# Patient Record
Sex: Male | Born: 1995 | Race: White | Hispanic: No | Marital: Single | Smoking: Never smoker
Health system: Southern US, Community
[De-identification: ages and names within clinical notes are randomized; demographics above are authoritative.]

## PROBLEM LIST (undated history)

## (undated) DIAGNOSIS — L700 Acne vulgaris: Secondary | ICD-10-CM

## (undated) DIAGNOSIS — R030 Elevated blood-pressure reading, without diagnosis of hypertension: Secondary | ICD-10-CM

## (undated) HISTORY — DX: Acne vulgaris: L70.0

## (undated) HISTORY — DX: Elevated blood-pressure reading, without diagnosis of hypertension: R03.0

---
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
## 2016-02-22 ENCOUNTER — Emergency Department (HOSPITAL_COMMUNITY)
Admission: EM | Admit: 2016-02-22 | Discharge: 2016-02-23 | Disposition: A | Discharge status: Home / Self Care | Payer: Managed Care, Other (non HMO) | Attending: Emergency Medicine | Admitting: Emergency Medicine

## 2016-02-22 ENCOUNTER — Encounter (HOSPITAL_COMMUNITY): Payer: Self-pay

## 2016-02-22 ENCOUNTER — Emergency Department (HOSPITAL_COMMUNITY): Payer: Managed Care, Other (non HMO)

## 2016-02-22 DIAGNOSIS — Y929 Unspecified place or not applicable: Secondary | ICD-10-CM | POA: Diagnosis not present

## 2016-02-22 DIAGNOSIS — W230XXA Caught, crushed, jammed, or pinched between moving objects, initial encounter: Secondary | ICD-10-CM | POA: Insufficient documentation

## 2016-02-22 DIAGNOSIS — Y9389 Activity, other specified: Secondary | ICD-10-CM | POA: Insufficient documentation

## 2016-02-22 DIAGNOSIS — S61210A Laceration without foreign body of right index finger without damage to nail, initial encounter: Secondary | ICD-10-CM | POA: Diagnosis present

## 2016-02-22 DIAGNOSIS — Y990 Civilian activity done for income or pay: Secondary | ICD-10-CM | POA: Diagnosis not present

## 2016-02-22 MED ORDER — LIDOCAINE HCL (PF) 1 % IJ SOLN
5.0000 mL | Freq: Once | INTRAMUSCULAR | Status: AC
  Administered 2016-02-22: 5 mL
  Filled 2016-02-22: qty 5

## 2016-02-22 MED ORDER — TETANUS-DIPHTH-ACELL PERTUSSIS 5-2.5-18.5 LF-MCG/0.5 IM SUSP
0.5000 mL | Freq: Once | INTRAMUSCULAR | Status: AC
  Administered 2016-02-22: 0.5 mL via INTRAMUSCULAR
  Filled 2016-02-22: qty 0.5

## 2016-02-22 NOTE — ED Provider Notes (Signed)
[facility]-EMERGENCY DEPT Provider Note   CSN: [id number] Arrival date & time: 02/22/16  2033   By signing my name below, I, Clarisse GougeXavier Herndon, attest that this documentation has been prepared under the direction and in the presence of [facility] M Neese, FNP . Electronically Signed: Clarisse GougeXavier Herndon, Scribe. 02/22/16. 10:41 PM.   History   Chief Complaint Chief Complaint  Patient presents with  . Laceration   The history is provided by the patient and medical records. No language interpreter was used.    HPI Comments: Cody Gill is a 21 y.o. male who presents to the Emergency Department complaining of a right index finger laceration that he sustained ~1830 today. He notes he hit the finger between a hammer and a steel rod. Pt states he has not taken any medications for pain at home. Last tetanus unknown.  Past Medical History:  Diagnosis Date  . Acne vulgaris   . Elevated blood pressure reading     Patient Active Problem List   Diagnosis Date Noted  . Acne vulgaris 11/26/2015  . Elevated blood pressure reading 11/26/2015    History reviewed. No pertinent surgical history.     Home Medications    Prior to Admission medications   Medication Sig Start Date End Date Taking? Authorizing Provider  cetirizine (ZYRTEC) 10 MG tablet Take 10 mg by mouth daily.    Historical Provider, MD  clindamycin-benzoyl peroxide (BENZACLIN) gel Apply 1 application topically 2 (two) times daily. 11/26/15   Doreene NestKatherine K Clark, NP    Family History Family History  Problem Relation Age of Onset  . Hypertension Mother   . Hypertension Father   . Hypertension Maternal Grandmother   . Diabetes Maternal Grandmother     Social History Social History  Substance Use Topics  . Smoking status: Never Smoker  . Smokeless tobacco: Never Used  . Alcohol use No     Allergies   Patient has no known allergies.   Review of Systems Review of Systems  Gastrointestinal: Negative for nausea.    Musculoskeletal: Positive for arthralgias.       Left index finger  Skin: Positive for wound.  Neurological: Negative for syncope.  Psychiatric/Behavioral: The patient is not nervous/anxious.      Physical Exam Updated Vital Signs BP 153/84 (BP Location: Right Arm)   Pulse 89   Temp 98.2 F (36.8 C) (Oral)   Resp 16   Ht 6\' 5"  (1.956 m)   Wt 129.3 kg   SpO2 100%   BMI 33.80 kg/m   Physical Exam  Constitutional: He is oriented to person, place, and time. He appears well-developed and well-nourished. No distress.  HENT:  Head: Normocephalic.  Eyes: EOM are normal.  Neck: Normal range of motion. Neck supple.  Cardiovascular: Normal rate.  Exam reveals no gallop.   Pulmonary/Chest: Effort normal.  Musculoskeletal: Normal range of motion. He exhibits tenderness.       Arms: Neurological: He is alert and oriented to person, place, and time.  Skin:  Wound right index finger  Psychiatric: He has a normal mood and affect. His behavior is normal.  Nursing note and vitals reviewed.    ED Treatments / Results  DIAGNOSTIC STUDIES: Oxygen Saturation is 100% on RA, normal by my interpretation.    COORDINATION OF CARE: 10:41 PM Discussed treatment plan with pt at bedside and pt agreed to plan. Pt prepared for laceration repair. Will order pain medications.  Labs (all labs ordered are listed, but only abnormal results are displayed)  Labs Reviewed - No data to display   Radiology Dg Finger Index Right  Result Date: 02/22/2016 CLINICAL DATA:  Pain after hitting distal right index finger with a hammer. Bleeding near the nail bed. EXAM: RIGHT INDEX FINGER 2+V COMPARISON:  None. FINDINGS: There is no evidence of fracture or dislocation. There appears be a tiny soft tissue laceration along the radial aspect of right index finger distally. No radiopaque foreign body. Joint spaces appear intact. IMPRESSION: No acute osseous abnormality or malalignment of the right index finger. Tiny  soft tissue laceration along the radial aspect of the finger at the level of the distal phalanx. Electronically Signed   By: Tollie Eth M.D.   On: 02/22/2016 22:20    Procedures .Marland KitchenLaceration Repair Date/Time: 02/22/2016 11:27 PM Performed by: Janne Napoleon Authorized by: Janne Napoleon   Consent:    Consent obtained:  Verbal   Consent given by:  Patient   Risks discussed:  Infection   Alternatives discussed:  No treatment Anesthesia (see MAR for exact dosages):    Anesthesia method:  Local infiltration   Local anesthetic:  Lidocaine 1% w/o epi Laceration details:    Location:  Finger   Finger location:  R index finger   Length (cm):  1 Repair type:    Repair type:  Simple Pre-procedure details:    Preparation:  Patient was prepped and draped in usual sterile fashion and imaging obtained to evaluate for foreign bodies Exploration:    Wound extent: no foreign bodies/material noted and no underlying fracture noted     Contaminated: yes   Treatment:    Area cleansed with:  Betadine   Amount of cleaning:  Standard   Irrigation solution:  Sterile water   Irrigation method:  Syringe   Visualized foreign bodies/material removed: no   Skin repair:    Repair method:  Sutures   Suture size:  5-0   Suture material:  Prolene   Suture technique:  Simple interrupted   Number of sutures:  2 Approximation:    Approximation:  Close Post-procedure details:    Dressing:  Sterile dressing   Patient tolerance of procedure:  Tolerated well, no immediate complications     Medications Ordered in ED Medications  lidocaine (PF) (XYLOCAINE) 1 % injection 5 mL (5 mLs Infiltration Given 02/22/16 2257)  Tdap (BOOSTRIX) injection 0.5 mL (0.5 mLs Intramuscular Given 02/22/16 2257)     Initial Impression / Assessment and Plan / ED Course  I have reviewed the triage vital signs and the nursing notes.  Pertinent imaging results that were available during my care of the patient were reviewed by me  and considered in my medical decision making (see chart for details).    I personally performed the services described in this documentation, which was scribed in my presence. The recorded information has been reviewed and is accurate.    Final Clinical Impressions(s) / ED Diagnoses  21 y.o. male with flap laceration of the right index finger stable for d/c without focal neuro deficits. He will f/u in 7 days for suture removal or sooner for problems.  Final diagnoses:  Laceration of right index finger without foreign body without damage to nail, initial encounter    New Prescriptions Discharge Medication List as of 02/22/2016 11:47 PM       Janne Napoleon, NP 02/24/16 1610    Lorre Nick, MD 02/25/16 1445

## 2016-02-22 NOTE — ED Notes (Signed)
Patient's finger re-wrapped.  Cover removed while pt was at xray.

## 2016-02-22 NOTE — ED Triage Notes (Signed)
Pt states that he was working with a steel rod, and hit his finger with a hammer accidentally, laceration to R index finger, last tetanus unknown.

## 2016-11-28 ENCOUNTER — Ambulatory Visit (INDEPENDENT_AMBULATORY_CARE_PROVIDER_SITE_OTHER): Payer: Managed Care, Other (non HMO) | Admitting: Primary Care

## 2016-11-28 ENCOUNTER — Encounter: Payer: Self-pay | Admitting: Primary Care

## 2016-11-28 VITALS — BP 146/82 | HR 92 | Temp 98.5°F | Ht 77.0 in | Wt 312.8 lb

## 2016-11-28 DIAGNOSIS — R030 Elevated blood-pressure reading, without diagnosis of hypertension: Secondary | ICD-10-CM | POA: Diagnosis not present

## 2016-11-28 DIAGNOSIS — Z0000 Encounter for general adult medical examination without abnormal findings: Secondary | ICD-10-CM | POA: Insufficient documentation

## 2016-11-28 DIAGNOSIS — Z0001 Encounter for general adult medical examination with abnormal findings: Secondary | ICD-10-CM | POA: Insufficient documentation

## 2016-11-28 LAB — COMPREHENSIVE METABOLIC PANEL
ALK PHOS: 84 U/L (ref 39–117)
ALT: 21 U/L (ref 0–53)
AST: 17 U/L (ref 0–37)
Albumin: 4.6 g/dL (ref 3.5–5.2)
BILIRUBIN TOTAL: 0.5 mg/dL (ref 0.2–1.2)
BUN: 11 mg/dL (ref 6–23)
CALCIUM: 9.7 mg/dL (ref 8.4–10.5)
CO2: 30 meq/L (ref 19–32)
CREATININE: 0.93 mg/dL (ref 0.40–1.50)
Chloride: 102 mEq/L (ref 96–112)
GFR: 109.21 mL/min (ref >60.00)
Glucose, Bld: 82 mg/dL (ref 70–99)
Potassium: 4.2 mEq/L (ref 3.5–5.1)
Sodium: 139 mEq/L (ref 135–145)
TOTAL PROTEIN: 7.1 g/dL (ref 6.0–8.3)

## 2016-11-28 LAB — LIPID PANEL
CHOL/HDL RATIO: 4
Cholesterol: 145 mg/dL (ref 0–200)
HDL: 34.7 mg/dL — ABNORMAL LOW (ref >39.00)
LDL Cholesterol: 89 mg/dL (ref 0–99)
NonHDL: 110.52
TRIGLYCERIDES: 108 mg/dL (ref 0.0–149.0)
VLDL: 21.6 mg/dL (ref 0.0–40.0)

## 2016-11-28 LAB — HEMOGLOBIN A1C: Hgb A1c MFr Bld: 5.5 % (ref 4.6–6.5)

## 2016-11-28 LAB — TSH: TSH: 1.36 u[IU]/mL (ref 0.35–5.50)

## 2016-11-28 NOTE — Assessment & Plan Note (Signed)
Immunizations UTD. Discussed the importance of a healthy diet and regular exercise in order for weight loss, and to reduce the risk of other medical problems. Exam unremarkable. Labs pending. Follow up in 1 year for annual exam. 

## 2016-11-28 NOTE — Patient Instructions (Signed)
Complete lab work prior to leaving today. I will notify you of your results once received.   You MUST work on weight loss through a healthy diet and regular exercise.   Stop eating fried food, fast food, fatty food. Start looking at the nutrition facts online of your favorite restaurants.   Limit sweet tea as this adds a lot of sugar and calories in your diet.  Increase vegetables, fruit, whole grains, lean protein.  Ensure you are consuming 64 ounces of water daily.  Schedule a follow up visit in 3 months to recheck your blood pressure.   It was a pleasure to see you today!   DASH Eating Plan DASH stands for "Dietary Approaches to Stop Hypertension." The DASH eating plan is a healthy eating plan that has been shown to reduce high blood pressure (hypertension). It may also reduce your risk for type 2 diabetes, heart disease, and stroke. The DASH eating plan may also help with weight loss. What are tips for following this plan? General guidelines  Avoid eating more than 2,300 mg (milligrams) of salt (sodium) a day. If you have hypertension, you may need to reduce your sodium intake to 1,500 mg a day.  Limit alcohol intake to no more than 1 drink a day for nonpregnant women and 2 drinks a day for men. One drink equals 12 oz of beer, 5 oz of wine, or 1 oz of hard liquor.  Work with your health care provider to maintain a healthy body weight or to lose weight. Ask what an ideal weight is for you.  Get at least 30 minutes of exercise that causes your heart to beat faster (aerobic exercise) most days of the week. Activities may include walking, swimming, or biking.  Work with your health care provider or diet and nutrition specialist (dietitian) to adjust your eating plan to your individual calorie needs. Reading food labels  Check food labels for the amount of sodium per serving. Choose foods with less than 5 percent of the Daily Value of sodium. Generally, foods with less than 300 mg of  sodium per serving fit into this eating plan.  To find whole grains, look for the word "whole" as the first word in the ingredient list. Shopping  Buy products labeled as "low-sodium" or "no salt added."  Buy fresh foods. Avoid canned foods and premade or frozen meals. Cooking  Avoid adding salt when cooking. Use salt-free seasonings or herbs instead of table salt or sea salt. Check with your health care provider or pharmacist before using salt substitutes.  Do not fry foods. Cook foods using healthy methods such as baking, boiling, grilling, and broiling instead.  Cook with heart-healthy oils, such as olive, canola, soybean, or sunflower oil. Meal planning   Eat a balanced diet that includes: ? 5 or more servings of fruits and vegetables each day. At each meal, try to fill half of your plate with fruits and vegetables. ? Up to 6-8 servings of whole grains each day. ? Less than 6 oz of lean meat, poultry, or fish each day. A 3-oz serving of meat is about the same size as a deck of cards. One egg equals 1 oz. ? 2 servings of low-fat dairy each day. ? A serving of nuts, seeds, or beans 5 times each week. ? Heart-healthy fats. Healthy fats called Omega-3 fatty acids are found in foods such as flaxseeds and coldwater fish, like sardines, salmon, and mackerel.  Limit how much you eat of the following: ?  Canned or prepackaged foods. ? Food that is high in trans fat, such as fried foods. ? Food that is high in saturated fat, such as fatty meat. ? Sweets, desserts, sugary drinks, and other foods with added sugar. ? Full-fat dairy products.  Do not salt foods before eating.  Try to eat at least 2 vegetarian meals each week.  Eat more home-cooked food and less restaurant, buffet, and fast food.  When eating at a restaurant, ask that your food be prepared with less salt or no salt, if possible. What foods are recommended? The items listed may not be a complete list. Talk with your  dietitian about what dietary choices are best for you. Grains Whole-grain or whole-wheat bread. Whole-grain or whole-wheat pasta. Brown rice. [place]. Bulgur. Whole-grain and low-sodium cereals. Pita bread. Low-fat, low-sodium crackers. Whole-wheat flour tortillas. Vegetables Fresh or frozen vegetables (raw, steamed, roasted, or grilled). Low-sodium or reduced-sodium tomato and vegetable juice. Low-sodium or reduced-sodium tomato sauce and tomato paste. Low-sodium or reduced-sodium canned vegetables. Fruits All fresh, dried, or frozen fruit. Canned fruit in natural juice (without added sugar). Meat and other protein foods Skinless chicken or [place]. Ground chicken or [place]. Pork with fat trimmed off. Fish and seafood. Egg whites. Dried beans, peas, or lentils. Unsalted nuts, nut butters, and seeds. Unsalted canned beans. Lean cuts of beef with fat trimmed off. Low-sodium, lean deli meat. Dairy Low-fat (1%) or fat-free (skim) milk. Fat-free, low-fat, or reduced-fat cheeses. Nonfat, low-sodium ricotta or cottage cheese. Low-fat or nonfat yogurt. Low-fat, low-sodium cheese. Fats and oils Soft margarine without trans fats. Vegetable oil. Low-fat, reduced-fat, or light mayonnaise and salad dressings (reduced-sodium). Canola, safflower, olive, soybean, and sunflower oils. Avocado. Seasoning and other foods Herbs. Spices. Seasoning mixes without salt. Unsalted popcorn and pretzels. Fat-free sweets. What foods are not recommended? The items listed may not be a complete list. Talk with your dietitian about what dietary choices are best for you. Grains Baked goods made with fat, such as croissants, muffins, or some breads. Dry pasta or rice meal packs. Vegetables Creamed or fried vegetables. Vegetables in a cheese sauce. Regular canned vegetables (not low-sodium or reduced-sodium). Regular canned tomato sauce and paste (not low-sodium or reduced-sodium). Regular tomato and vegetable juice (not  low-sodium or reduced-sodium). Angie Fava. Olives. Fruits Canned fruit in a light or heavy syrup. Fried fruit. Fruit in cream or butter sauce. Meat and other protein foods Fatty cuts of meat. Ribs. Fried meat. Berniece Salines. Sausage. Bologna and other processed lunch meats. Salami. Fatback. Hotdogs. Bratwurst. Salted nuts and seeds. Canned beans with added salt. Canned or smoked fish. Whole eggs or egg yolks. Chicken or [place] with skin. Dairy Whole or 2% milk, cream, and half-and-half. Whole or full-fat cream cheese. Whole-fat or sweetened yogurt. Full-fat cheese. Nondairy creamers. Whipped toppings. Processed cheese and cheese spreads. Fats and oils Butter. Stick margarine. Lard. Shortening. Ghee. Bacon fat. Tropical oils, such as coconut, palm kernel, or palm oil. Seasoning and other foods Salted popcorn and pretzels. Onion salt, garlic salt, seasoned salt, table salt, and sea salt. Worcestershire sauce. Tartar sauce. Barbecue sauce. Teriyaki sauce. Soy sauce, including reduced-sodium. Steak sauce. Canned and packaged gravies. Fish sauce. Oyster sauce. Cocktail sauce. Horseradish that you find on the shelf. Ketchup. Mustard. Meat flavorings and tenderizers. Bouillon cubes. Hot sauce and Tabasco sauce. Premade or packaged marinades. Premade or packaged taco seasonings. Relishes. Regular salad dressings. Where to find more information:  National Heart, Lung, and [place]: https://wilson-eaton.com/  American Heart Association: www.heart.org Summary  The DASH eating plan is a healthy eating plan that has been shown to reduce high blood pressure (hypertension). It may also reduce your risk for type 2 diabetes, heart disease, and stroke.  With the DASH eating plan, you should limit salt (sodium) intake to 2,300 mg a day. If you have hypertension, you may need to reduce your sodium intake to 1,500 mg a day.  When on the DASH eating plan, aim to eat more fresh fruits and vegetables, whole grains, lean proteins,  low-fat dairy, and heart-healthy fats.  Work with your health care provider or diet and nutrition specialist (dietitian) to adjust your eating plan to your individual calorie needs. This information is not intended to replace advice given to you by your health care provider. Make sure you discuss any questions you have with your health care provider. Document Released: 12/16/2010 Document Revised: 12/21/2015 Document Reviewed: 12/21/2015 Elsevier Interactive Patient Education  2017 [employer]

## 2016-11-28 NOTE — Progress Notes (Signed)
Subjective:    Patient ID: Cody Gill, male    DOB: 12/23/1995, 21 y.o.   MRN: [id number]  HPI  Cody Gill is a 21 year old male who presents today for complete physical and a chief complaint of elevated blood pressure.  1) Elevated Blood Pressure: History of elevated readings for one year. He's been checking his BP at [facility] and is getting readings of 140's-150's/80's. He denies headaches, dizziness, chest pain. He has a strong family history of hypertension.   BP Readings from Last 3 Encounters:  11/28/16 (!) 146/82  02/23/16 153/84  11/26/15 140/78 (91 %, Z = 1.36 /  69 %, Z = 0.50)*   *BP percentiles are based on the August 2017 AAP Clinical Practice Guideline for boys     Immunizations: -Tetanus: Completed in 2018 -Influenza: Completed this season   Diet: He endorses a fair diet Breakfast: Cereal, fast food Lunch: Arts development officerestaurant food Dinner: Restaurants, home cooked meals (pasta, soup, meat) Snacks: None Desserts: Occasionally  Beverages: Water, sweet tea, soda   Exercise: He walks on the treadmill for 30 minutes at a time 3 days weekly. Eye exam: Completed years ago. Dental exam: Completes semi-annually    Review of Systems  Constitutional: Negative for unexpected weight change.  HENT: Negative for rhinorrhea.   Respiratory: Negative for cough and shortness of breath.   Cardiovascular: Negative for chest pain.  Gastrointestinal: Negative for constipation and diarrhea.  Genitourinary: Negative for difficulty urinating.  Musculoskeletal: Negative for arthralgias and myalgias.  Skin: Negative for rash.  Allergic/Immunologic: Negative for environmental allergies.  Neurological: Negative for dizziness, numbness and headaches.  Psychiatric/Behavioral:       Denies concerns for anxiety and depression       Past Medical History:  Diagnosis Date  . Acne vulgaris   . Elevated blood pressure reading      Social History   Socioeconomic History  . Marital  status: Single    Spouse name: Not on file  . Number of children: Not on file  . Years of education: Not on file  . Highest education level: Not on file  Social Needs  . Financial resource strain: Not on file  . Food insecurity - worry: Not on file  . Food insecurity - inability: Not on file  . Transportation needs - medical: Not on file  . Transportation needs - non-medical: Not on file  Occupational History  . Not on file  Tobacco Use  . Smoking status: Never Smoker  . Smokeless tobacco: Never Used  Substance and Sexual Activity  . Alcohol use: No  . Drug use: No  . Sexual activity: Not on file  Other Topics Concern  . Not on file  Social History Narrative   Single.   Works as a Games developerdiesel mechanic.   Enjoys working on cars.     No past surgical history on file.  Family History  Problem Relation Age of Onset  . Hypertension Mother   . Hypertension Father   . Hypertension Maternal Grandmother   . Diabetes Maternal Grandmother     No Known Allergies  Current Outpatient Medications on File Prior to Visit  Medication Sig Dispense Refill  . cetirizine (ZYRTEC) 10 MG tablet Take 10 mg by mouth daily.    . clindamycin-benzoyl peroxide (BENZACLIN) gel Apply 1 application topically 2 (two) times daily. 50 g 3  . AFLURIA QUADRIVALENT 0.5 ML injection TO BE ADMINISTERED BY PHARMACIST FOR IMMUNIZATION  0   No current facility-administered medications on  file prior to visit.     BP (!) 146/82   Pulse 92   Temp 98.5 F (36.9 C) (Oral)   Ht 6\' 5"  (1.956 m)   Wt (!) 312 lb 12.8 oz (141.9 kg)   SpO2 98%   BMI 37.09 kg/m    Objective:   Physical Exam  Constitutional: He is oriented to person, place, and time. He appears well-nourished.  HENT:  Right Ear: Tympanic membrane and ear canal normal.  Left Ear: Tympanic membrane and ear canal normal.  Nose: Nose normal. Right sinus exhibits no maxillary sinus tenderness and no frontal sinus tenderness. Left sinus exhibits no  maxillary sinus tenderness and no frontal sinus tenderness.  Mouth/Throat: Oropharynx is clear and moist.  Eyes: Conjunctivae and EOM are normal. Pupils are equal, round, and reactive to light.  Neck: Neck supple. Carotid bruit is not present. No thyromegaly present.  Cardiovascular: Normal rate, regular rhythm and normal heart sounds.  Pulmonary/Chest: Effort normal and breath sounds normal. He has no wheezes. He has no rales.  Abdominal: Soft. Bowel sounds are normal. There is no tenderness.  Musculoskeletal: Normal range of motion.  Neurological: He is alert and oriented to person, place, and time. He has normal reflexes. No cranial nerve deficit.  Skin: Skin is warm and dry.  Psychiatric: He has a normal mood and affect.          Assessment & Plan:

## 2016-11-28 NOTE — Assessment & Plan Note (Signed)
Elevated readings for one year, also at drug stores. Strong family history of this. Long discussion today about treatment options, he would like to work on weight loss through diet and exercise. Information provided today. Check for secondary causes of hypertension through labs. Follow up in 3 months for BP check, this will give him time for weight loss.

## 2017-01-19 ENCOUNTER — Other Ambulatory Visit: Payer: Self-pay | Admitting: Primary Care

## 2017-01-19 DIAGNOSIS — L700 Acne vulgaris: Secondary | ICD-10-CM

## 2017-03-02 ENCOUNTER — Ambulatory Visit: Payer: Managed Care, Other (non HMO) | Admitting: Primary Care

## 2017-03-02 DIAGNOSIS — I10 Essential (primary) hypertension: Secondary | ICD-10-CM

## 2017-03-02 MED ORDER — LISINOPRIL 20 MG PO TABS: 20.0000 mg | ORAL_TABLET | Freq: Every day | ORAL | 0 refills | Status: DC

## 2017-03-02 NOTE — Patient Instructions (Signed)
Start lisinopril 20 mg tablets for high blood pressure. Take 1 tablet by mouth once daily.  Start monitoring your blood pressure daily, around the same time of day, for the next 2-3 weeks.  Ensure that you have rested for 30 minutes prior to checking your blood pressure. Record your readings and bring them to your next visit.  Schedule a follow up visit in 2-3 weeks for re-evaluation. Bring your BP numbers.   It was a pleasure to see you today!    DASH Eating Plan DASH stands for "Dietary Approaches to Stop Hypertension." The DASH eating plan is a healthy eating plan that has been shown to reduce high blood pressure (hypertension). It may also reduce your risk for type 2 diabetes, heart disease, and stroke. The DASH eating plan may also help with weight loss. What are tips for following this plan? General guidelines  Avoid eating more than 2,300 mg (milligrams) of salt (sodium) a day. If you have hypertension, you may need to reduce your sodium intake to 1,500 mg a day.  Limit alcohol intake to no more than 1 drink a day for nonpregnant women and 2 drinks a day for men. One drink equals 12 oz of beer, 5 oz of wine, or 1 oz of hard liquor.  Work with your health care provider to maintain a healthy body weight or to lose weight. Ask what an ideal weight is for you.  Get at least 30 minutes of exercise that causes your heart to beat faster (aerobic exercise) most days of the week. Activities may include walking, swimming, or biking.  Work with your health care provider or diet and nutrition specialist (dietitian) to adjust your eating plan to your individual calorie needs. Reading food labels  Check food labels for the amount of sodium per serving. Choose foods with less than 5 percent of the Daily Value of sodium. Generally, foods with less than 300 mg of sodium per serving fit into this eating plan.  To find whole grains, look for the word "whole" as the first word in the ingredient  list. Shopping  Buy products labeled as "low-sodium" or "no salt added."  Buy fresh foods. Avoid canned foods and premade or frozen meals. Cooking  Avoid adding salt when cooking. Use salt-free seasonings or herbs instead of table salt or sea salt. Check with your health care provider or pharmacist before using salt substitutes.  Do not fry foods. Cook foods using healthy methods such as baking, boiling, grilling, and broiling instead.  Cook with heart-healthy oils, such as olive, canola, soybean, or sunflower oil. Meal planning   Eat a balanced diet that includes: ? 5 or more servings of fruits and vegetables each day. At each meal, try to fill half of your plate with fruits and vegetables. ? Up to 6-8 servings of whole grains each day. ? Less than 6 oz of lean meat, poultry, or fish each day. A 3-oz serving of meat is about the same size as a deck of cards. One egg equals 1 oz. ? 2 servings of low-fat dairy each day. ? A serving of nuts, seeds, or beans 5 times each week. ? Heart-healthy fats. Healthy fats called Omega-3 fatty acids are found in foods such as flaxseeds and coldwater fish, like sardines, salmon, and mackerel.  Limit how much you eat of the following: ? Canned or prepackaged foods. ? Food that is high in trans fat, such as fried foods. ? Food that is high in saturated fat, such  as fatty meat. ? Sweets, desserts, sugary drinks, and other foods with added sugar. ? Full-fat dairy products.  Do not salt foods before eating.  Try to eat at least 2 vegetarian meals each week.  Eat more home-cooked food and less restaurant, buffet, and fast food.  When eating at a restaurant, ask that your food be prepared with less salt or no salt, if possible. What foods are recommended? The items listed may not be a complete list. Talk with your dietitian about what dietary choices are best for you. Grains Whole-grain or whole-wheat bread. Whole-grain or whole-wheat pasta. Brown  rice. [place]. Bulgur. Whole-grain and low-sodium cereals. Pita bread. Low-fat, low-sodium crackers. Whole-wheat flour tortillas. Vegetables Fresh or frozen vegetables (raw, steamed, roasted, or grilled). Low-sodium or reduced-sodium tomato and vegetable juice. Low-sodium or reduced-sodium tomato sauce and tomato paste. Low-sodium or reduced-sodium canned vegetables. Fruits All fresh, dried, or frozen fruit. Canned fruit in natural juice (without added sugar). Meat and other protein foods Skinless chicken or [place]. Ground chicken or [place]. Pork with fat trimmed off. Fish and seafood. Egg whites. Dried beans, peas, or lentils. Unsalted nuts, nut butters, and seeds. Unsalted canned beans. Lean cuts of beef with fat trimmed off. Low-sodium, lean deli meat. Dairy Low-fat (1%) or fat-free (skim) milk. Fat-free, low-fat, or reduced-fat cheeses. Nonfat, low-sodium ricotta or cottage cheese. Low-fat or nonfat yogurt. Low-fat, low-sodium cheese. Fats and oils Soft margarine without trans fats. Vegetable oil. Low-fat, reduced-fat, or light mayonnaise and salad dressings (reduced-sodium). Canola, safflower, olive, soybean, and sunflower oils. Avocado. Seasoning and other foods Herbs. Spices. Seasoning mixes without salt. Unsalted popcorn and pretzels. Fat-free sweets. What foods are not recommended? The items listed may not be a complete list. Talk with your dietitian about what dietary choices are best for you. Grains Baked goods made with fat, such as croissants, muffins, or some breads. Dry pasta or rice meal packs. Vegetables Creamed or fried vegetables. Vegetables in a cheese sauce. Regular canned vegetables (not low-sodium or reduced-sodium). Regular canned tomato sauce and paste (not low-sodium or reduced-sodium). Regular tomato and vegetable juice (not low-sodium or reduced-sodium). Angie Fava. Olives. Fruits Canned fruit in a light or heavy syrup. Fried fruit. Fruit in cream or butter  sauce. Meat and other protein foods Fatty cuts of meat. Ribs. Fried meat. Berniece Salines. Sausage. Bologna and other processed lunch meats. Salami. Fatback. Hotdogs. Bratwurst. Salted nuts and seeds. Canned beans with added salt. Canned or smoked fish. Whole eggs or egg yolks. Chicken or [place] with skin. Dairy Whole or 2% milk, cream, and half-and-half. Whole or full-fat cream cheese. Whole-fat or sweetened yogurt. Full-fat cheese. Nondairy creamers. Whipped toppings. Processed cheese and cheese spreads. Fats and oils Butter. Stick margarine. Lard. Shortening. Ghee. Bacon fat. Tropical oils, such as coconut, palm kernel, or palm oil. Seasoning and other foods Salted popcorn and pretzels. Onion salt, garlic salt, seasoned salt, table salt, and sea salt. Worcestershire sauce. Tartar sauce. Barbecue sauce. Teriyaki sauce. Soy sauce, including reduced-sodium. Steak sauce. Canned and packaged gravies. Fish sauce. Oyster sauce. Cocktail sauce. Horseradish that you find on the shelf. Ketchup. Mustard. Meat flavorings and tenderizers. Bouillon cubes. Hot sauce and Tabasco sauce. Premade or packaged marinades. Premade or packaged taco seasonings. Relishes. Regular salad dressings. Where to find more information:  National Heart, Lung, and [place]: https://wilson-eaton.com/  American Heart Association: www.heart.org Summary  The DASH eating plan is a healthy eating plan that has been shown to reduce high blood pressure (hypertension). It may also reduce your risk  for type 2 diabetes, heart disease, and stroke.  With the DASH eating plan, you should limit salt (sodium) intake to 2,300 mg a day. If you have hypertension, you may need to reduce your sodium intake to 1,500 mg a day.  When on the DASH eating plan, aim to eat more fresh fruits and vegetables, whole grains, lean proteins, low-fat dairy, and heart-healthy fats.  Work with your health care provider or diet and nutrition specialist (dietitian) to adjust  your eating plan to your individual calorie needs. This information is not intended to replace advice given to you by your health care provider. Make sure you discuss any questions you have with your health care provider. Document Released: 12/16/2010 Document Revised: 12/21/2015 Document Reviewed: 12/21/2015 Elsevier Interactive Patient Education  [employer].

## 2017-03-02 NOTE — Assessment & Plan Note (Signed)
Mild improvement in BP since last visit, has been working on lifestyle changes. Long discussion today regarding continued improvement in diet and exercise, he's not sure if he can do this. He's continued elevated readings at home. Given strong family history with little BP improvement and lack of serious effort for weight loss moving forward, will treat.  Rx for lisinopril 20 mg sent to pharmacy. He will continue to monitor BP at home. Return in 2-3 weeks for follow up and BMP.

## 2017-03-02 NOTE — Progress Notes (Signed)
Subjective:    Patient ID: Cody Gill, male    DOB: 03-21-1995, 22 y.o.   MRN: [id number]  HPI  Mr. Bollier is a 22 year old male who presents today for follow up of elevated blood pressure readings. He's noted elevated blood pressure readings over the last several years which run 140150's/80's. During his last visit his labs were grossly unremarkable. He has a strong family history of hypertension in both parents.  He was encouraged to work on weight loss and return in three months for evaluation.   Since his last visit he's been trying to work on weight loss through diet and exercise. He's not eating out much at restaurants, or will pick healthier options. He is now walking on the treadmill twice weekly, sometimes more. He's been checking his BP at home starting 5 days ago which is running 135's-140's/70's.  He denies headaches, dizziness, chest pain.   Wt Readings from Last 3 Encounters:  03/02/17 (!) 305 lb (138.3 kg)  11/28/16 (!) 312 lb 12.8 oz (141.9 kg)  02/22/16 285 lb (129.3 kg)     BP Readings from Last 3 Encounters:  03/02/17 140/78  11/28/16 (!) 146/82  02/23/16 153/84     Review of Systems  Eyes: Negative for visual disturbance.  Respiratory: Negative for shortness of breath.   Cardiovascular: Negative for chest pain.  Neurological: Negative for dizziness and headaches.       Past Medical History:  Diagnosis Date  . Acne vulgaris   . Elevated blood pressure reading      Social History   Socioeconomic History  . Marital status: Single    Spouse name: Not on file  . Number of children: Not on file  . Years of education: Not on file  . Highest education level: Not on file  Social Needs  . Financial resource strain: Not on file  . Food insecurity - worry: Not on file  . Food insecurity - inability: Not on file  . Transportation needs - medical: Not on file  . Transportation needs - non-medical: Not on file  Occupational History  . Not on file    Tobacco Use  . Smoking status: Never Smoker  . Smokeless tobacco: Never Used  Substance and Sexual Activity  . Alcohol use: No  . Drug use: No  . Sexual activity: Not on file  Other Topics Concern  . Not on file  Social History Narrative   Single.   Works as a Games developer.   Enjoys working on cars.     No past surgical history on file.  Family History  Problem Relation Age of Onset  . Hypertension Mother   . Hypertension Father   . Hypertension Maternal Grandmother   . Diabetes Maternal Grandmother     No Known Allergies  Current Outpatient Medications on File Prior to Visit  Medication Sig Dispense Refill  . cetirizine (ZYRTEC) 10 MG tablet Take 10 mg by mouth daily.    . clindamycin-benzoyl peroxide (BENZACLIN) gel APPLY 1 APPLICATION TOPICALLY 2 (TWO) TIMES DAILY 50 g 1   No current facility-administered medications on file prior to visit.     BP 140/78   Pulse 79   Temp 98.3 F (36.8 C) (Oral)   Ht 6\' 5"  (1.956 m)   Wt (!) 305 lb (138.3 kg)   SpO2 98%   BMI 36.17 kg/m    Objective:   Physical Exam  Constitutional: He is oriented to person, place, and time. He appears  well-nourished.  Neck: Neck supple.  Cardiovascular: Normal rate and regular rhythm.  Pulmonary/Chest: Effort normal and breath sounds normal. He has no wheezes. He has no rales.  Neurological: He is alert and oriented to person, place, and time.  Skin: Skin is warm and dry.          Assessment & Plan:

## 2017-03-23 ENCOUNTER — Encounter: Payer: Self-pay | Admitting: Primary Care

## 2017-03-23 ENCOUNTER — Ambulatory Visit: Payer: Managed Care, Other (non HMO) | Admitting: Primary Care

## 2017-03-23 DIAGNOSIS — I10 Essential (primary) hypertension: Secondary | ICD-10-CM

## 2017-03-23 LAB — BASIC METABOLIC PANEL
BUN: 17 mg/dL (ref 6–23)
CALCIUM: 9.4 mg/dL (ref 8.4–10.5)
CO2: 30 mEq/L (ref 19–32)
CREATININE: 1.04 mg/dL (ref 0.40–1.50)
Chloride: 104 mEq/L (ref 96–112)
GFR: 95.7 mL/min (ref >60.00)
Glucose, Bld: 97 mg/dL (ref 70–99)
Potassium: 4 mEq/L (ref 3.5–5.1)
Sodium: 140 mEq/L (ref 135–145)

## 2017-03-23 MED ORDER — LISINOPRIL 20 MG PO TABS: 20.0000 mg | ORAL_TABLET | Freq: Every day | ORAL | 1 refills | Status: DC

## 2017-03-23 NOTE — Progress Notes (Signed)
Subjective:    Patient ID: Cody Gill, male    DOB: 1995-06-01, 22 y.o.   MRN: [id number]  HPI  Cody Gill is a 22 year old me with a history of obesity and newly diagnosed hypertension who presents today for follow up.  He was last evaluated in late February 2019 with continued elevated blood pressure readings despite lifestyle improvements. There was slight decrease in readings, but he reported that he didn't think he could continue to work on lifestyle improvements enough in order to get BP down to goal, he also has a strong family history of hypertension. He was prescribed lisinopril 20 mg and encouraged to continue with lifestyle improvements, and follow up in 2-3 weeks.  Since his last visit he's checking his BP at home and is getting readings 130's/80's mostly, some 140's/90's soon after he started the medication. He's also been under stress and isn't sleeping much as he's working during the day, then working on his kitchen floors at night. He denies chest pain, cough. He has noticed some dizziness with abrupt position changes.   BP Readings from Last 3 Encounters:  03/23/17 138/74  03/02/17 140/78  11/28/16 (!) 146/82     Review of Systems  Constitutional: Negative for fatigue.  Eyes: Negative for visual disturbance.  Respiratory: Negative for cough and shortness of breath.   Cardiovascular: Negative for chest pain.  Neurological: Negative for headaches.       Past Medical History:  Diagnosis Date  . Acne vulgaris   . Elevated blood pressure reading      Social History   Socioeconomic History  . Marital status: Single    Spouse name: Not on file  . Number of children: Not on file  . Years of education: Not on file  . Highest education level: Not on file  Social Needs  . Financial resource strain: Not on file  . Food insecurity - worry: Not on file  . Food insecurity - inability: Not on file  . Transportation needs - medical: Not on file  .  Transportation needs - non-medical: Not on file  Occupational History  . Not on file  Tobacco Use  . Smoking status: Never Smoker  . Smokeless tobacco: Never Used  Substance and Sexual Activity  . Alcohol use: No  . Drug use: No  . Sexual activity: Not on file  Other Topics Concern  . Not on file  Social History Narrative   Single.   Works as a Games developer.   Enjoys working on cars.     No past surgical history on file.  Family History  Problem Relation Age of Onset  . Hypertension Mother   . Hypertension Father   . Hypertension Maternal Grandmother   . Diabetes Maternal Grandmother     No Known Allergies  Current Outpatient Medications on File Prior to Visit  Medication Sig Dispense Refill  . cetirizine (ZYRTEC) 10 MG tablet Take 10 mg by mouth daily.    Marland Kitchen lisinopril (PRINIVIL,ZESTRIL) 20 MG tablet Take 1 tablet (20 mg total) by mouth daily. 30 tablet 0  . clindamycin-benzoyl peroxide (BENZACLIN) gel APPLY 1 APPLICATION TOPICALLY 2 (TWO) TIMES DAILY (Patient not taking: Reported on 03/23/2017) 50 g 1   No current facility-administered medications on file prior to visit.     BP 138/74   Pulse 74   Temp 98.3 F (36.8 C) (Oral)   Ht 6\' 5"  (1.956 m)   Wt (!) 305 lb 8 oz (138.6 kg)  SpO2 98%   BMI 36.23 kg/m    Objective:   Physical Exam  Constitutional: He is oriented to person, place, and time. He appears well-nourished.  Neck: Neck supple.  Cardiovascular: Normal rate and regular rhythm.  Pulmonary/Chest: Effort normal and breath sounds normal. He has no wheezes. He has no rales.  Neurological: He is alert and oriented to person, place, and time.  Skin: Skin is warm and dry.          Assessment & Plan:

## 2017-03-23 NOTE — Assessment & Plan Note (Signed)
Improved on lisinopril 20 mg, home readings lower. Will have him continue on this regimen for now, continue to monitor home readings and report readings at or above 135/90 on a consistent basis.   Follow up in 3 months for BP check. BMP pending today.

## 2017-03-23 NOTE — Patient Instructions (Signed)
Stop by the lab prior to leaving today. I will notify you of your results once received.   Continue lisinopril 20 mg tablets for high blood pressure.  Monitor your blood pressure 1-2 times weekly, report readings at or above 135/90 on a consistent basis.   Schedule a follow up visit for 3 months.  It was a pleasure to see you today!   DASH Eating Plan DASH stands for "Dietary Approaches to Stop Hypertension." The DASH eating plan is a healthy eating plan that has been shown to reduce high blood pressure (hypertension). It may also reduce your risk for type 2 diabetes, heart disease, and stroke. The DASH eating plan may also help with weight loss. What are tips for following this plan? General guidelines  Avoid eating more than 2,300 mg (milligrams) of salt (sodium) a day. If you have hypertension, you may need to reduce your sodium intake to 1,500 mg a day.  Limit alcohol intake to no more than 1 drink a day for nonpregnant women and 2 drinks a day for men. One drink equals 12 oz of beer, 5 oz of wine, or 1 oz of hard liquor.  Work with your health care provider to maintain a healthy body weight or to lose weight. Ask what an ideal weight is for you.  Get at least 30 minutes of exercise that causes your heart to beat faster (aerobic exercise) most days of the week. Activities may include walking, swimming, or biking.  Work with your health care provider or diet and nutrition specialist (dietitian) to adjust your eating plan to your individual calorie needs. Reading food labels  Check food labels for the amount of sodium per serving. Choose foods with less than 5 percent of the Daily Value of sodium. Generally, foods with less than 300 mg of sodium per serving fit into this eating plan.  To find whole grains, look for the word "whole" as the first word in the ingredient list. Shopping  Buy products labeled as "low-sodium" or "no salt added."  Buy fresh foods. Avoid canned foods and  premade or frozen meals. Cooking  Avoid adding salt when cooking. Use salt-free seasonings or herbs instead of table salt or sea salt. Check with your health care provider or pharmacist before using salt substitutes.  Do not fry foods. Cook foods using healthy methods such as baking, boiling, grilling, and broiling instead.  Cook with heart-healthy oils, such as olive, canola, soybean, or sunflower oil. Meal planning   Eat a balanced diet that includes: ? 5 or more servings of fruits and vegetables each day. At each meal, try to fill half of your plate with fruits and vegetables. ? Up to 6-8 servings of whole grains each day. ? Less than 6 oz of lean meat, poultry, or fish each day. A 3-oz serving of meat is about the same size as a deck of cards. One egg equals 1 oz. ? 2 servings of low-fat dairy each day. ? A serving of nuts, seeds, or beans 5 times each week. ? Heart-healthy fats. Healthy fats called Omega-3 fatty acids are found in foods such as flaxseeds and coldwater fish, like sardines, salmon, and mackerel.  Limit how much you eat of the following: ? Canned or prepackaged foods. ? Food that is high in trans fat, such as fried foods. ? Food that is high in saturated fat, such as fatty meat. ? Sweets, desserts, sugary drinks, and other foods with added sugar. ? Full-fat dairy products.  Do  not salt foods before eating.  Try to eat at least 2 vegetarian meals each week.  Eat more home-cooked food and less restaurant, buffet, and fast food.  When eating at a restaurant, ask that your food be prepared with less salt or no salt, if possible. What foods are recommended? The items listed may not be a complete list. Talk with your dietitian about what dietary choices are best for you. Grains Whole-grain or whole-wheat bread. Whole-grain or whole-wheat pasta. Brown rice. [place]. Bulgur. Whole-grain and low-sodium cereals. Pita bread. Low-fat, low-sodium crackers.  Whole-wheat flour tortillas. Vegetables Fresh or frozen vegetables (raw, steamed, roasted, or grilled). Low-sodium or reduced-sodium tomato and vegetable juice. Low-sodium or reduced-sodium tomato sauce and tomato paste. Low-sodium or reduced-sodium canned vegetables. Fruits All fresh, dried, or frozen fruit. Canned fruit in natural juice (without added sugar). Meat and other protein foods Skinless chicken or [place]. Ground chicken or [place]. Pork with fat trimmed off. Fish and seafood. Egg whites. Dried beans, peas, or lentils. Unsalted nuts, nut butters, and seeds. Unsalted canned beans. Lean cuts of beef with fat trimmed off. Low-sodium, lean deli meat. Dairy Low-fat (1%) or fat-free (skim) milk. Fat-free, low-fat, or reduced-fat cheeses. Nonfat, low-sodium ricotta or cottage cheese. Low-fat or nonfat yogurt. Low-fat, low-sodium cheese. Fats and oils Soft margarine without trans fats. Vegetable oil. Low-fat, reduced-fat, or light mayonnaise and salad dressings (reduced-sodium). Canola, safflower, olive, soybean, and sunflower oils. Avocado. Seasoning and other foods Herbs. Spices. Seasoning mixes without salt. Unsalted popcorn and pretzels. Fat-free sweets. What foods are not recommended? The items listed may not be a complete list. Talk with your dietitian about what dietary choices are best for you. Grains Baked goods made with fat, such as croissants, muffins, or some breads. Dry pasta or rice meal packs. Vegetables Creamed or fried vegetables. Vegetables in a cheese sauce. Regular canned vegetables (not low-sodium or reduced-sodium). Regular canned tomato sauce and paste (not low-sodium or reduced-sodium). Regular tomato and vegetable juice (not low-sodium or reduced-sodium). Angie Fava. Olives. Fruits Canned fruit in a light or heavy syrup. Fried fruit. Fruit in cream or butter sauce. Meat and other protein foods Fatty cuts of meat. Ribs. Fried meat. Berniece Salines. Sausage. Bologna and other  processed lunch meats. Salami. Fatback. Hotdogs. Bratwurst. Salted nuts and seeds. Canned beans with added salt. Canned or smoked fish. Whole eggs or egg yolks. Chicken or [place] with skin. Dairy Whole or 2% milk, cream, and half-and-half. Whole or full-fat cream cheese. Whole-fat or sweetened yogurt. Full-fat cheese. Nondairy creamers. Whipped toppings. Processed cheese and cheese spreads. Fats and oils Butter. Stick margarine. Lard. Shortening. Ghee. Bacon fat. Tropical oils, such as coconut, palm kernel, or palm oil. Seasoning and other foods Salted popcorn and pretzels. Onion salt, garlic salt, seasoned salt, table salt, and sea salt. Worcestershire sauce. Tartar sauce. Barbecue sauce. Teriyaki sauce. Soy sauce, including reduced-sodium. Steak sauce. Canned and packaged gravies. Fish sauce. Oyster sauce. Cocktail sauce. Horseradish that you find on the shelf. Ketchup. Mustard. Meat flavorings and tenderizers. Bouillon cubes. Hot sauce and Tabasco sauce. Premade or packaged marinades. Premade or packaged taco seasonings. Relishes. Regular salad dressings. Where to find more information:  National Heart, Lung, and [place]: https://wilson-eaton.com/  American Heart Association: www.heart.org Summary  The DASH eating plan is a healthy eating plan that has been shown to reduce high blood pressure (hypertension). It may also reduce your risk for type 2 diabetes, heart disease, and stroke.  With the DASH eating plan, you should limit salt (sodium) intake  to 2,300 mg a day. If you have hypertension, you may need to reduce your sodium intake to 1,500 mg a day.  When on the DASH eating plan, aim to eat more fresh fruits and vegetables, whole grains, lean proteins, low-fat dairy, and heart-healthy fats.  Work with your health care provider or diet and nutrition specialist (dietitian) to adjust your eating plan to your individual calorie needs. This information is not intended to replace advice given to  you by your health care provider. Make sure you discuss any questions you have with your health care provider. Document Released: 12/16/2010 Document Revised: 12/21/2015 Document Reviewed: 12/21/2015 Elsevier Interactive Patient Education  [employer]2018 Elsevier Inc.

## 2017-03-24 ENCOUNTER — Encounter: Payer: Self-pay | Admitting: *Deleted

## 2017-03-27 ENCOUNTER — Encounter: Payer: Self-pay | Admitting: *Deleted

## 2017-06-23 ENCOUNTER — Ambulatory Visit: Payer: Managed Care, Other (non HMO) | Admitting: Primary Care

## 2017-06-23 ENCOUNTER — Encounter: Payer: Self-pay | Admitting: Primary Care

## 2017-06-23 DIAGNOSIS — I10 Essential (primary) hypertension: Secondary | ICD-10-CM | POA: Diagnosis not present

## 2017-06-23 NOTE — Patient Instructions (Signed)
Continue lisinopril 20 mg tablets for high blood pressure.  Start exercising. You should be getting 150 minutes of moderate intensity exercise weekly.  It's important to improve your diet by reducing consumption of fast food, fried food, processed snack foods, sugary drinks. Increase consumption of fresh vegetables and fruits, whole grains, water.  Ensure you are drinking 64 ounces of water daily.  We will see you in November for your physical or in one year.  It was a pleasure to see you today!

## 2017-06-23 NOTE — Assessment & Plan Note (Signed)
Improved and doing well. Home BP readings are the same. BMP from last visit unremarkable.   Strongly advised he work on weight loss through diet and exercise.   We will see him back in the Fall for his CPE.

## 2017-06-23 NOTE — Progress Notes (Signed)
Subjective:    Patient ID: Cody Gill, male    DOB: May 01, 1995, 22 y.o.   MRN: [id number]  HPI  Cody Gill is a 22 year old male who presents today for follow up of hypertension.  He is currently managed on lisinopril 20 mg that was initiated in February 2019 due to minimal improvement in BP despite mild lifestyle changes. During his last visit his blood pressure had improved.   Since his last visit he's checking his BP at home and is getting readings of 120-130's systolic. He's not doing much to improve his diet. He is not exercising. He denies chest pain, dizziness, cough.   Wt Readings from Last 3 Encounters:  06/23/17 (!) 307 lb 8 oz (139.5 kg)  03/23/17 (!) 305 lb 8 oz (138.6 kg)  03/02/17 (!) 305 lb (138.3 kg)      BP Readings from Last 3 Encounters:  06/23/17 122/70  03/23/17 138/74  03/02/17 140/78     Review of Systems  Eyes: Negative for visual disturbance.  Respiratory: Negative for shortness of breath.   Cardiovascular: Negative for chest pain.  Neurological: Negative for dizziness and headaches.       Past Medical History:  Diagnosis Date  . Acne vulgaris   . Elevated blood pressure reading      Social History   Socioeconomic History  . Marital status: Single    Spouse name: Not on file  . Number of children: Not on file  . Years of education: Not on file  . Highest education level: Not on file  Occupational History  . Not on file  Social Needs  . Financial resource strain: Not on file  . Food insecurity:    Worry: Not on file    Inability: Not on file  . Transportation needs:    Medical: Not on file    Non-medical: Not on file  Tobacco Use  . Smoking status: Never Smoker  . Smokeless tobacco: Never Used  Substance and Sexual Activity  . Alcohol use: No  . Drug use: No  . Sexual activity: Not on file  Lifestyle  . Physical activity:    Days per week: Not on file    Minutes per session: Not on file  . Stress: Not on file    Relationships  . Social connections:    Talks on phone: Not on file    Gets together: Not on file    Attends religious service: Not on file    Active member of club or organization: Not on file    Attends meetings of clubs or organizations: Not on file    Relationship status: Not on file  . Intimate partner violence:    Fear of current or ex partner: Not on file    Emotionally abused: Not on file    Physically abused: Not on file    Forced sexual activity: Not on file  Other Topics Concern  . Not on file  Social History Narrative   Single.   Works as a Games developerdiesel mechanic.   Enjoys working on cars.     No past surgical history on file.  Family History  Problem Relation Age of Onset  . Hypertension Mother   . Hypertension Father   . Hypertension Maternal Grandmother   . Diabetes Maternal Grandmother     No Known Allergies  Current Outpatient Medications on File Prior to Visit  Medication Sig Dispense Refill  . cetirizine (ZYRTEC) 10 MG tablet Take 10 mg by  mouth daily.    Marland Kitchen lisinopril (PRINIVIL,ZESTRIL) 20 MG tablet Take 1 tablet (20 mg total) by mouth daily. 90 tablet 1  . clindamycin-benzoyl peroxide (BENZACLIN) gel APPLY 1 APPLICATION TOPICALLY 2 (TWO) TIMES DAILY (Patient not taking: Reported on 03/23/2017) 50 g 1   No current facility-administered medications on file prior to visit.     BP 122/70   Pulse 72   Temp 98.2 F (36.8 C) (Oral)   Ht 6\' 5"  (1.956 m)   Wt (!) 307 lb 8 oz (139.5 kg)   SpO2 98%   BMI 36.46 kg/m    Objective:   Physical Exam  Constitutional: He appears well-nourished.  Neck: Neck supple.  Cardiovascular: Normal rate and regular rhythm.  Respiratory: Effort normal and breath sounds normal.  Skin: Skin is warm and dry.           Assessment & Plan:

## 2017-07-11 ENCOUNTER — Other Ambulatory Visit: Payer: Self-pay | Admitting: Primary Care

## 2017-07-11 DIAGNOSIS — L700 Acne vulgaris: Secondary | ICD-10-CM

## 2017-09-22 ENCOUNTER — Other Ambulatory Visit: Payer: Self-pay | Admitting: Primary Care

## 2017-09-22 DIAGNOSIS — I10 Essential (primary) hypertension: Secondary | ICD-10-CM

## 2017-11-21 ENCOUNTER — Other Ambulatory Visit: Payer: Self-pay | Admitting: Primary Care

## 2017-11-21 DIAGNOSIS — I10 Essential (primary) hypertension: Secondary | ICD-10-CM

## 2017-11-29 ENCOUNTER — Other Ambulatory Visit (INDEPENDENT_AMBULATORY_CARE_PROVIDER_SITE_OTHER): Payer: Managed Care, Other (non HMO)

## 2017-11-29 DIAGNOSIS — I10 Essential (primary) hypertension: Secondary | ICD-10-CM

## 2017-11-29 LAB — LIPID PANEL
Cholesterol: 138 mg/dL (ref 0–200)
HDL: 28.8 mg/dL — AB (ref >39.00)
LDL Cholesterol: 95 mg/dL (ref 0–99)
NonHDL: 109.34
TRIGLYCERIDES: 72 mg/dL (ref 0.0–149.0)
Total CHOL/HDL Ratio: 5
VLDL: 14.4 mg/dL (ref 0.0–40.0)

## 2017-11-29 LAB — COMPREHENSIVE METABOLIC PANEL
ALT: 25 U/L (ref 0–53)
AST: 20 U/L (ref 0–37)
Albumin: 4.4 g/dL (ref 3.5–5.2)
Alkaline Phosphatase: 77 U/L (ref 39–117)
BILIRUBIN TOTAL: 0.5 mg/dL (ref 0.2–1.2)
BUN: 16 mg/dL (ref 6–23)
CALCIUM: 9.7 mg/dL (ref 8.4–10.5)
CO2: 30 meq/L (ref 19–32)
Chloride: 103 mEq/L (ref 96–112)
Creatinine, Ser: 1.03 mg/dL (ref 0.40–1.50)
GFR: 96.15 mL/min (ref >60.00)
GLUCOSE: 131 mg/dL — AB (ref 70–99)
POTASSIUM: 4.5 meq/L (ref 3.5–5.1)
Sodium: 139 mEq/L (ref 135–145)
Total Protein: 6.7 g/dL (ref 6.0–8.3)

## 2017-12-04 ENCOUNTER — Encounter: Payer: Self-pay | Admitting: Primary Care

## 2017-12-04 ENCOUNTER — Ambulatory Visit (INDEPENDENT_AMBULATORY_CARE_PROVIDER_SITE_OTHER): Payer: Managed Care, Other (non HMO) | Admitting: Primary Care

## 2017-12-04 ENCOUNTER — Encounter: Payer: Managed Care, Other (non HMO) | Admitting: Primary Care

## 2017-12-04 VITALS — BP 134/74 | HR 91 | Temp 98.2°F | Ht 77.0 in | Wt 332.0 lb

## 2017-12-04 DIAGNOSIS — G8929 Other chronic pain: Secondary | ICD-10-CM | POA: Insufficient documentation

## 2017-12-04 DIAGNOSIS — M25521 Pain in right elbow: Secondary | ICD-10-CM | POA: Diagnosis not present

## 2017-12-04 DIAGNOSIS — R739 Hyperglycemia, unspecified: Secondary | ICD-10-CM

## 2017-12-04 DIAGNOSIS — I10 Essential (primary) hypertension: Secondary | ICD-10-CM | POA: Diagnosis not present

## 2017-12-04 DIAGNOSIS — M25529 Pain in unspecified elbow: Secondary | ICD-10-CM

## 2017-12-04 DIAGNOSIS — Z0000 Encounter for general adult medical examination without abnormal findings: Secondary | ICD-10-CM

## 2017-12-04 HISTORY — DX: Other chronic pain: G89.29

## 2017-12-04 LAB — POCT GLYCOSYLATED HEMOGLOBIN (HGB A1C): Hemoglobin A1C: 5.2 % (ref 4.0–5.6)

## 2017-12-04 NOTE — Assessment & Plan Note (Signed)
Borderline in the office today, recommended he work on a healthy diet and regular exercise. Continue lisinopril. BMP overall unremarkable.

## 2017-12-04 NOTE — Assessment & Plan Note (Addendum)
Immunizations UTD per patient. No record of HPV vaccinations on [facility] however patient endorses having all three HPV vaccinations. Discussed the importance of a healthy diet and regular exercise in order for weight loss, and to reduce the risk of any potential medical problems. Exam unremarkable. Labs reviewed. Follow up in 1 year for CPE.

## 2017-12-04 NOTE — Patient Instructions (Signed)
Stop by the lab prior to leaving today. I will notify you of your results once received.   Start exercising. You should be getting 150 minutes of moderate intensity exercise weekly.  It's important to improve your diet by reducing consumption of fast food, fried food, processed snack foods, sugary drinks. Increase consumption of fresh vegetables and fruits, whole grains, water.  Ensure you are drinking 64 ounces of water daily.  Continue to monitor your blood pressure and notify me if you continue to run at or above 135/90.  Please schedule a follow up appointment in 6 months for blood pressure check.   It was a pleasure to see you today!   Preventive Care 18-39 Years, Male Preventive care refers to lifestyle choices and visits with your health care provider that can promote health and wellness. What does preventive care include?  A yearly physical exam. This is also called an annual well check.  Dental exams once or twice a year.  Routine eye exams. Ask your health care provider how often you should have your eyes checked.  Personal lifestyle choices, including: ? Daily care of your teeth and gums. ? Regular physical activity. ? Eating a healthy diet. ? Avoiding tobacco and drug use. ? Limiting alcohol use. ? Practicing safe sex. What happens during an annual well check? The services and screenings done by your health care provider during your annual well check will depend on your age, overall health, lifestyle risk factors, and family history of disease. Counseling Your health care provider may ask you questions about your:  Alcohol use.  Tobacco use.  Drug use.  Emotional well-being.  Home and relationship well-being.  Sexual activity.  Eating habits.  Work and work Statistician.  Screening You may have the following tests or measurements:  Height, weight, and BMI.  Blood pressure.  Lipid and cholesterol levels. These may be checked every 5 years starting  at age 13.  Diabetes screening. This is done by checking your blood sugar (glucose) after you have not eaten for a while (fasting).  Skin check.  Hepatitis C blood test.  Hepatitis B blood test.  Sexually transmitted disease (STD) testing.  Discuss your test results, treatment options, and if necessary, the need for more tests with your health care provider. Vaccines Your health care provider may recommend certain vaccines, such as:  Influenza vaccine. This is recommended every year.  Tetanus, diphtheria, and acellular pertussis (Tdap, Td) vaccine. You may need a Td booster every 10 years.  Varicella vaccine. You may need this if you have not been vaccinated.  HPV vaccine. If you are 66 or younger, you may need three doses over 6 months.  Measles, mumps, and rubella (MMR) vaccine. You may need at least one dose of MMR.You may also need a second dose.  Pneumococcal 13-valent conjugate (PCV13) vaccine. You may need this if you have certain conditions and have not been vaccinated.  Pneumococcal polysaccharide (PPSV23) vaccine. You may need one or two doses if you smoke cigarettes or if you have certain conditions.  Meningococcal vaccine. One dose is recommended if you are age 40-21 years and a first-year college student living in a residence hall, or if you have one of several medical conditions. You may also need additional booster doses.  Hepatitis A vaccine. You may need this if you have certain conditions or if you travel or work in places where you may be exposed to hepatitis A.  Hepatitis B vaccine. You may need this if you  have certain conditions or if you travel or work in places where you may be exposed to hepatitis B.  Haemophilus influenzae type b (Hib) vaccine. You may need this if you have certain risk factors.  Talk to your health care provider about which screenings and vaccines you need and how often you need them. This information is not intended to replace  advice given to you by your health care provider. Make sure you discuss any questions you have with your health care provider. Document Released: 02/22/2001 Document Revised: 09/16/2015 Document Reviewed: 10/28/2014 Elsevier Interactive Patient Education  [employer].

## 2017-12-04 NOTE — Progress Notes (Signed)
Subjective:    Patient ID: Cody Gill, male    DOB: 12-Feb-1995, 22 y.o.   MRN: [id number]  HPI  Cody Gill is a 22 year old male who presents today for complete physical.  Chronic right elbow pain x 2 years after hitting his right elbow on concrete. He will notice intermittent swelling. He doesn't notice pain on a daily basis, but will notice pain if he slightly hits his elbow on something or swings a hammer.   Immunizations: -Tetanus: Completed in 2016 -Influenza: Completed this season  -HPV: Thinks he's had this done.  Diet: He endorses a fair diet Breakfast: Biscuit sandwich  Lunch: Fast food (sandwich, burger, etc) Dinner: Fast food, meat, vegetables, starch Snacks: None Desserts: None Beverages: Sweet tea, water  Exercise: He is not exercising  Eye exam: Completed several years ago Dental exam: Completes semi-annually   BP Readings from Last 3 Encounters:  12/04/17 134/74  06/23/17 122/70  03/23/17 138/74   He's checking his BP at home infrequently which runs 120-130's/70's.   Review of Systems  Constitutional: Negative for unexpected weight change.  HENT: Negative for rhinorrhea.   Respiratory: Negative for cough and shortness of breath.   Cardiovascular: Negative for chest pain.  Gastrointestinal: Negative for constipation and diarrhea.  Genitourinary: Negative for difficulty urinating.  Musculoskeletal: Positive for arthralgias.  Skin: Negative for rash.  Allergic/Immunologic: Negative for environmental allergies.  Neurological: Negative for dizziness, numbness and headaches.       Some dizziness when changing positions too quickly  Psychiatric/Behavioral: The patient is not nervous/anxious.        Past Medical History:  Diagnosis Date  . Acne vulgaris   . Elevated blood pressure reading      Social History   Socioeconomic History  . Marital status: Single    Spouse name: Not on file  . Number of children: Not on file  . Years of  education: Not on file  . Highest education level: Not on file  Occupational History  . Not on file  Social Needs  . Financial resource strain: Not on file  . Food insecurity:    Worry: Not on file    Inability: Not on file  . Transportation needs:    Medical: Not on file    Non-medical: Not on file  Tobacco Use  . Smoking status: Never Smoker  . Smokeless tobacco: Never Used  Substance and Sexual Activity  . Alcohol use: No  . Drug use: No  . Sexual activity: Not on file  Lifestyle  . Physical activity:    Days per week: Not on file    Minutes per session: Not on file  . Stress: Not on file  Relationships  . Social connections:    Talks on phone: Not on file    Gets together: Not on file    Attends religious service: Not on file    Active member of club or organization: Not on file    Attends meetings of clubs or organizations: Not on file    Relationship status: Not on file  . Intimate partner violence:    Fear of current or ex partner: Not on file    Emotionally abused: Not on file    Physically abused: Not on file    Forced sexual activity: Not on file  Other Topics Concern  . Not on file  Social History Narrative   Single.   Works as a Games developerdiesel mechanic.   Enjoys working on cars.  No past surgical history on file.  Family History  Problem Relation Age of Onset  . Hypertension Mother   . Hypertension Father   . Hypertension Maternal Grandmother   . Diabetes Maternal Grandmother     No Known Allergies  Current Outpatient Medications on File Prior to Visit  Medication Sig Dispense Refill  . cetirizine (ZYRTEC) 10 MG tablet Take 10 mg by mouth daily.    . clindamycin-benzoyl peroxide (BENZACLIN) gel APPLY 1 APPLICATION TOPICALLY 2 (TWO) TIMES DAILY 50 g 1  . lisinopril (PRINIVIL,ZESTRIL) 20 MG tablet TAKE 1 TABLET BY MOUTH EVERY DAY 90 tablet 1   No current facility-administered medications on file prior to visit.     BP 134/74   Pulse 91   Temp  98.2 F (36.8 C) (Oral)   Ht 6\' 5"  (1.956 m)   Wt (!) 332 lb (150.6 kg)   SpO2 97%   BMI 39.37 kg/m    Objective:   Physical Exam  Constitutional: He is oriented to person, place, and time. He appears well-nourished.  HENT:  Mouth/Throat: No oropharyngeal exudate.  Eyes: Pupils are equal, round, and reactive to light. EOM are normal.  Neck: Neck supple. No thyromegaly present.  Cardiovascular: Normal rate and regular rhythm.  Respiratory: Effort normal and breath sounds normal.  GI: Soft. Bowel sounds are normal. There is no tenderness.  Musculoskeletal: Normal range of motion.       Right elbow: He exhibits normal range of motion, no swelling and no deformity. No tenderness found.  Neurological: He is alert and oriented to person, place, and time.  Skin: Skin is warm and dry.  Psychiatric: He has a normal mood and affect.           Assessment & Plan:

## 2017-12-04 NOTE — Assessment & Plan Note (Signed)
Since 2 years ago after hitting the elbow accidentally on concrete. Exam today unremarkable. He will see Sports Medicine. Declines xray today.

## 2017-12-05 ENCOUNTER — Other Ambulatory Visit: Payer: Self-pay | Admitting: Primary Care

## 2017-12-05 DIAGNOSIS — L700 Acne vulgaris: Secondary | ICD-10-CM

## 2018-03-19 ENCOUNTER — Other Ambulatory Visit: Payer: Self-pay | Admitting: Primary Care

## 2018-03-19 DIAGNOSIS — I10 Essential (primary) hypertension: Secondary | ICD-10-CM

## 2018-06-05 ENCOUNTER — Ambulatory Visit: Payer: Managed Care, Other (non HMO) | Admitting: Primary Care

## 2018-07-30 ENCOUNTER — Ambulatory Visit: Payer: Self-pay | Admitting: Primary Care

## 2018-08-02 ENCOUNTER — Encounter: Payer: Self-pay | Admitting: Primary Care

## 2018-08-02 ENCOUNTER — Ambulatory Visit: Payer: No Typology Code available for payment source | Admitting: Primary Care

## 2018-08-02 ENCOUNTER — Other Ambulatory Visit: Payer: Self-pay

## 2018-08-02 DIAGNOSIS — I10 Essential (primary) hypertension: Secondary | ICD-10-CM | POA: Diagnosis not present

## 2018-08-02 NOTE — Progress Notes (Signed)
Subjective:    Patient ID: Cody Gill, male    DOB: [birth year], 23 y.o.   MRN: [id number]  HPI  Cody Gill is a 23 year old male who presents today for follow up of hypertension.  He was last evaluated in November 2019 for hypertension. Blood pressure was borderline that day on Lisinopril 20 mg, but he wanted a chance to work on lifestyle changes prior to a dose increase.  Since his last visit he's lost 10 pounds through dietary changes and some exercise. He is compliant to his lisinopril daily. He does have a mild cough after eating that isn't bothersome. He denies chest pain, dizziness, ankle edema.   BP Readings from Last 3 Encounters:  08/02/18 120/64  12/04/17 134/74  06/23/17 122/70   Wt Readings from Last 3 Encounters:  08/02/18 (!) 322 lb 8 oz (146.3 kg)  12/04/17 (!) 332 lb (150.6 kg)  06/23/17 (!) 307 lb 8 oz (139.5 kg)     Review of Systems  Eyes: Negative for visual disturbance.  Respiratory: Negative for shortness of breath.   Cardiovascular: Negative for chest pain.  Neurological: Negative for dizziness and headaches.       Past Medical History:  Diagnosis Date  . Acne vulgaris   . Elevated blood pressure reading      Social History   Socioeconomic History  . Marital status: Single    Spouse name: Not on file  . Number of children: Not on file  . Years of education: Not on file  . Highest education level: Not on file  Occupational History  . Not on file  Social Needs  . Financial resource strain: Not on file  . Food insecurity    Worry: Not on file    Inability: Not on file  . Transportation needs    Medical: Not on file    Non-medical: Not on file  Tobacco Use  . Smoking status: Never Smoker  . Smokeless tobacco: Never Used  Substance and Sexual Activity  . Alcohol use: No  . Drug use: No  . Sexual activity: Not on file  Lifestyle  . Physical activity    Days per week: Not on file    Minutes per session: Not on file  . Stress:  Not on file  Relationships  . Social Musicianconnections    Talks on phone: Not on file    Gets together: Not on file    Attends religious service: Not on file    Active member of club or organization: Not on file    Attends meetings of clubs or organizations: Not on file    Relationship status: Not on file  . Intimate partner violence    Fear of current or ex partner: Not on file    Emotionally abused: Not on file    Physically abused: Not on file    Forced sexual activity: Not on file  Other Topics Concern  . Not on file  Social History Narrative   Single.   Works as a Games developerdiesel mechanic.   Enjoys working on cars.     No past surgical history on file.  Family History  Problem Relation Age of Onset  . Hypertension Mother   . Hypertension Father   . Hypertension Maternal Grandmother   . Diabetes Maternal Grandmother     No Known Allergies  Current Outpatient Medications on File Prior to Visit  Medication Sig Dispense Refill  . cetirizine (ZYRTEC) 10 MG tablet Take 10 mg by  mouth daily.    . clindamycin-benzoyl peroxide (BENZACLIN) gel APPLY 1 APPLICATION TOPICALLY 2 (TWO) TIMES DAILY 50 g 1  . lisinopril (PRINIVIL,ZESTRIL) 20 MG tablet TAKE 1 TABLET BY MOUTH EVERY DAY 90 tablet 1   No current facility-administered medications on file prior to visit.     BP 120/64   Pulse 68   Temp 98.6 F (37 C) (Temporal)   Ht 6\' 5"  (1.956 m)   Wt (!) 322 lb 8 oz (146.3 kg)   SpO2 97%   BMI 38.24 kg/m    Objective:   Physical Exam  Constitutional: He appears well-nourished.  Neck: Neck supple.  Cardiovascular: Normal rate and regular rhythm.  Respiratory: Effort normal and breath sounds normal.  Skin: Skin is warm and dry.           Assessment & Plan:

## 2018-08-02 NOTE — Patient Instructions (Signed)
Continue lisinopril 20 mg tablets once daily for blood pressure.  Continue exercising. You should be getting 150 minutes of moderate intensity exercise weekly.  Continue to work on your diet by limiting portion sizes, junk food, fast food, etc.  Ensure you are consuming 64 ounces of water daily.  Please schedule a physical with me for late November/early December 2020. You may also schedule a lab only appointment 3-4 days prior. We will discuss your lab results in detail during your physical.  It was a pleasure to see you today!

## 2018-08-02 NOTE — Assessment & Plan Note (Signed)
Improved since last visit with lifestyle changes, commended him on this!  Continue lisinopril 20 mg daily. Continue to work on diet and exercise. Labs due this Fall/Winter.

## 2018-08-26 ENCOUNTER — Other Ambulatory Visit: Payer: Self-pay | Admitting: Primary Care

## 2018-08-26 DIAGNOSIS — L700 Acne vulgaris: Secondary | ICD-10-CM

## 2018-09-08 ENCOUNTER — Other Ambulatory Visit: Payer: Self-pay | Admitting: Primary Care

## 2018-09-08 DIAGNOSIS — I10 Essential (primary) hypertension: Secondary | ICD-10-CM

## 2018-11-29 ENCOUNTER — Other Ambulatory Visit: Payer: Self-pay | Admitting: Primary Care

## 2018-11-29 DIAGNOSIS — I10 Essential (primary) hypertension: Secondary | ICD-10-CM

## 2018-12-03 ENCOUNTER — Other Ambulatory Visit: Payer: Self-pay

## 2018-12-03 ENCOUNTER — Other Ambulatory Visit (INDEPENDENT_AMBULATORY_CARE_PROVIDER_SITE_OTHER): Payer: No Typology Code available for payment source

## 2018-12-03 DIAGNOSIS — I10 Essential (primary) hypertension: Secondary | ICD-10-CM

## 2018-12-03 LAB — COMPREHENSIVE METABOLIC PANEL
ALT: 29 U/L (ref 0–53)
AST: 17 U/L (ref 0–37)
Albumin: 4.2 g/dL (ref 3.5–5.2)
Alkaline Phosphatase: 81 U/L (ref 39–117)
BUN: 14 mg/dL (ref 6–23)
CO2: 27 mEq/L (ref 19–32)
Calcium: 9.5 mg/dL (ref 8.4–10.5)
Chloride: 103 mEq/L (ref 96–112)
Creatinine, Ser: 0.9 mg/dL (ref 0.40–1.50)
GFR: 104.73 mL/min (ref >60.00)
Glucose, Bld: 95 mg/dL (ref 70–99)
Potassium: 4.3 mEq/L (ref 3.5–5.1)
Sodium: 138 mEq/L (ref 135–145)
Total Bilirubin: 0.5 mg/dL (ref 0.2–1.2)
Total Protein: 6.7 g/dL (ref 6.0–8.3)

## 2018-12-03 LAB — LIPID PANEL
Cholesterol: 174 mg/dL (ref 0–200)
HDL: 33.5 mg/dL — ABNORMAL LOW (ref >39.00)
LDL Cholesterol: 121 mg/dL — ABNORMAL HIGH (ref 0–99)
NonHDL: 140.89
Total CHOL/HDL Ratio: 5
Triglycerides: 101 mg/dL (ref 0.0–149.0)
VLDL: 20.2 mg/dL (ref 0.0–40.0)

## 2018-12-10 ENCOUNTER — Other Ambulatory Visit: Payer: Self-pay

## 2018-12-10 ENCOUNTER — Ambulatory Visit (INDEPENDENT_AMBULATORY_CARE_PROVIDER_SITE_OTHER): Payer: No Typology Code available for payment source | Admitting: Primary Care

## 2018-12-10 ENCOUNTER — Encounter: Payer: Self-pay | Admitting: Primary Care

## 2018-12-10 VITALS — BP 120/76 | HR 62 | Temp 97.9°F | Ht 77.0 in | Wt 330.0 lb

## 2018-12-10 DIAGNOSIS — I10 Essential (primary) hypertension: Secondary | ICD-10-CM | POA: Diagnosis not present

## 2018-12-10 DIAGNOSIS — Z0000 Encounter for general adult medical examination without abnormal findings: Secondary | ICD-10-CM | POA: Diagnosis not present

## 2018-12-10 DIAGNOSIS — L700 Acne vulgaris: Secondary | ICD-10-CM

## 2018-12-10 NOTE — Patient Instructions (Addendum)
Start exercising. You should be getting 150 minutes of moderate intensity exercise weekly.  It's important to improve your diet by reducing consumption of fast food, fried food, processed snack foods, sugary drinks. Increase consumption of fresh vegetables and fruits, whole grains, water.  Ensure you are drinking 64 ounces of water daily.  Continue lisinopril 20 mg for blood pressure.  It was a pleasure to see you today!   Preventive Care 60-22 Years Old, Male Preventive care refers to lifestyle choices and visits with your health care provider that can promote health and wellness. This includes:  A yearly physical exam. This is also called an annual well check.  Regular dental and eye exams.  Immunizations.  Screening for certain conditions.  Healthy lifestyle choices, such as eating a healthy diet, getting regular exercise, not using drugs or products that contain nicotine and tobacco, and limiting alcohol use. What can I expect for my preventive care visit? Physical exam Your health care provider will check:  Height and weight. These may be used to calculate body mass index (BMI), which is a measurement that tells if you are at a healthy weight.  Heart rate and blood pressure.  Your skin for abnormal spots. Counseling Your health care provider may ask you questions about:  Alcohol, tobacco, and drug use.  Emotional well-being.  Home and relationship well-being.  Sexual activity.  Eating habits.  Work and work Statistician. What immunizations do I need?  Influenza (flu) vaccine  This is recommended every year. Tetanus, diphtheria, and pertussis (Tdap) vaccine  You may need a Td booster every 10 years. Varicella (chickenpox) vaccine  You may need this vaccine if you have not already been vaccinated. Human papillomavirus (HPV) vaccine  If recommended by your health care provider, you may need three doses over 6 months. Measles, mumps, and rubella (MMR)  vaccine  You may need at least one dose of MMR. You may also need a second dose. Meningococcal conjugate (MenACWY) vaccine  One dose is recommended if you are 82-26 years old and a Market researcher living in a residence hall, or if you have one of several medical conditions. You may also need additional booster doses. Pneumococcal conjugate (PCV13) vaccine  You may need this if you have certain conditions and were not previously vaccinated. Pneumococcal polysaccharide (PPSV23) vaccine  You may need one or two doses if you smoke cigarettes or if you have certain conditions. Hepatitis A vaccine  You may need this if you have certain conditions or if you travel or work in places where you may be exposed to hepatitis A. Hepatitis B vaccine  You may need this if you have certain conditions or if you travel or work in places where you may be exposed to hepatitis B. Haemophilus influenzae type b (Hib) vaccine  You may need this if you have certain risk factors. You may receive vaccines as individual doses or as more than one vaccine together in one shot (combination vaccines). Talk with your health care provider about the risks and benefits of combination vaccines. What tests do I need? Blood tests  Lipid and cholesterol levels. These may be checked every 5 years starting at age 74.  Hepatitis C test.  Hepatitis B test. Screening   Diabetes screening. This is done by checking your blood sugar (glucose) after you have not eaten for a while (fasting).  Sexually transmitted disease (STD) testing. Talk with your health care provider about your test results, treatment options, and if necessary, the  need for more tests. Follow these instructions at home: Eating and drinking   Eat a diet that includes fresh fruits and vegetables, whole grains, lean protein, and low-fat dairy products.  Take vitamin and mineral supplements as recommended by your health care provider.  Do not  drink alcohol if your health care provider tells you not to drink.  If you drink alcohol: ? Limit how much you have to 0-2 drinks a day. ? Be aware of how much alcohol is in your drink. In the [place] one drink equals one 12 oz bottle of beer (355 mL), one 5 oz glass of wine (148 mL), or one 1 oz glass of hard liquor (44 mL). Lifestyle  Take daily care of your teeth and gums.  Stay active. Exercise for at least 30 minutes on 5 or more days each week.  Do not use any products that contain nicotine or tobacco, such as cigarettes, e-cigarettes, and chewing tobacco. If you need help quitting, ask your health care provider.  If you are sexually active, practice safe sex. Use a condom or other form of protection to prevent STIs (sexually transmitted infections). What's next?  Go to your health care provider once a year for a well check visit.  Ask your health care provider how often you should have your eyes and teeth checked.  Stay up to date on all vaccines. This information is not intended to replace advice given to you by your health care provider. Make sure you discuss any questions you have with your health care provider. Document Released: 02/22/2001 Document Revised: 12/21/2017 Document Reviewed: 12/21/2017 Elsevier Patient Education  2020 [employer].

## 2018-12-10 NOTE — Assessment & Plan Note (Signed)
Stable in the office today, recent BMP stable. He does have an occasional cough that is not bothersome, he will update if this becomes bothersome.  Continue same.

## 2018-12-10 NOTE — Progress Notes (Signed)
Subjective:    Patient ID: Cody Gill, male    DOB: 04-18-1995, 23 y.o.   MRN: [id number]  HPI  Cody Gill is a 23 year old male who presents today for complete physical.  Immunizations: -Tetanus: Completed in 2018 -Influenza: Completed this season  -HPV: Completed series   Diet: He endorses a fair diet. Take out food during lunch. Desserts infrequently. Drinking water, sweet tea.  Exercise: He is not exercising. Active at work.  Eye exam: No recent exam Dental exam: Completed in 2020  BP Readings from Last 3 Encounters:  12/10/18 120/76  08/02/18 120/64  12/04/17 134/74   Wt Readings from Last 3 Encounters:  12/10/18 (!) 330 lb (149.7 kg)  08/02/18 (!) 322 lb 8 oz (146.3 kg)  12/04/17 (!) 332 lb (150.6 kg)     Review of Systems  Constitutional: Negative for unexpected weight change.  HENT: Negative for rhinorrhea.   Respiratory: Negative for cough and shortness of breath.   Cardiovascular: Negative for chest pain.  Gastrointestinal: Negative for constipation and diarrhea.  Genitourinary: Negative for difficulty urinating.  Musculoskeletal: Negative for arthralgias and myalgias.  Skin: Negative for rash.  Allergic/Immunologic: Negative for environmental allergies.  Neurological: Negative for dizziness, numbness and headaches.  Psychiatric/Behavioral: The patient is not nervous/anxious.        Past Medical History:  Diagnosis Date  . Acne vulgaris   . Elevated blood pressure reading      Social History   Socioeconomic History  . Marital status: Single    Spouse name: Not on file  . Number of children: Not on file  . Years of education: Not on file  . Highest education level: Not on file  Occupational History  . Not on file  Social Needs  . Financial resource strain: Not on file  . Food insecurity    Worry: Not on file    Inability: Not on file  . Transportation needs    Medical: Not on file    Non-medical: Not on file  Tobacco Use  .  Smoking status: Never Smoker  . Smokeless tobacco: Never Used  Substance and Sexual Activity  . Alcohol use: No  . Drug use: No  . Sexual activity: Not on file  Lifestyle  . Physical activity    Days per week: Not on file    Minutes per session: Not on file  . Stress: Not on file  Relationships  . Social Herbalist on phone: Not on file    Gets together: Not on file    Attends religious service: Not on file    Active member of club or organization: Not on file    Attends meetings of clubs or organizations: Not on file    Relationship status: Not on file  . Intimate partner violence    Fear of current or ex partner: Not on file    Emotionally abused: Not on file    Physically abused: Not on file    Forced sexual activity: Not on file  Other Topics Concern  . Not on file  Social History Narrative   Single.   Works as a Engineer, building services.   Enjoys working on cars.     No past surgical history on file.  Family History  Problem Relation Age of Onset  . Hypertension Mother   . Hypertension Father   . Hypertension Maternal Grandmother   . Diabetes Maternal Grandmother     No Known Allergies  Current Outpatient  Medications on File Prior to Visit  Medication Sig Dispense Refill  . cetirizine (ZYRTEC) 10 MG tablet Take 10 mg by mouth daily.    . clindamycin-benzoyl peroxide (BENZACLIN) gel APPLY 1 APPLICATION TOPICALLY 2 (TWO) TIMES DAILY 50 g 1  . lisinopril (ZESTRIL) 20 MG tablet TAKE 1 TABLET BY MOUTH EVERY DAY 90 tablet 1   No current facility-administered medications on file prior to visit.     BP 120/76   Pulse 62   Temp 97.9 F (36.6 C) (Temporal)   Ht 6\' 5"  (1.956 m)   Wt (!) 330 lb (149.7 kg)   SpO2 98%   BMI 39.13 kg/m    Objective:   Physical Exam  Constitutional: He is oriented to person, place, and time. He appears well-nourished.  HENT:  Right Ear: Tympanic membrane and ear canal normal.  Left Ear: Tympanic membrane and ear canal  normal.  Mouth/Throat: Oropharynx is clear and moist.  Eyes: Pupils are equal, round, and reactive to light. EOM are normal.  Neck: Neck supple.  Cardiovascular: Normal rate and regular rhythm.  Respiratory: Effort normal and breath sounds normal.  GI: Soft. Bowel sounds are normal. There is no abdominal tenderness.  Musculoskeletal: Normal range of motion.  Neurological: He is alert and oriented to person, place, and time. No cranial nerve deficit.  Reflex Scores:      Patellar reflexes are 2+ on the right side and 2+ on the left side. Skin: Skin is warm and dry.  Psychiatric: He has a normal mood and affect.           Assessment & Plan:

## 2018-12-10 NOTE — Assessment & Plan Note (Signed)
Doing well on PRN Benzaclin gel, continue same.

## 2018-12-10 NOTE — Assessment & Plan Note (Signed)
Immunizations UTD, including HPV series. Encouraged a healthy diet, regular exercise. Exam today unremarkable. Labs reviewed. Follow up in 1 year.

## 2019-03-22 ENCOUNTER — Other Ambulatory Visit: Payer: Self-pay | Admitting: Primary Care

## 2019-03-22 DIAGNOSIS — I10 Essential (primary) hypertension: Secondary | ICD-10-CM

## 2019-08-28 ENCOUNTER — Other Ambulatory Visit: Payer: Self-pay | Admitting: Primary Care

## 2019-08-28 DIAGNOSIS — L700 Acne vulgaris: Secondary | ICD-10-CM

## 2019-09-13 ENCOUNTER — Other Ambulatory Visit: Payer: Self-pay | Admitting: Primary Care

## 2019-09-13 DIAGNOSIS — I10 Essential (primary) hypertension: Secondary | ICD-10-CM

## 2019-11-14 ENCOUNTER — Ambulatory Visit: Payer: Self-pay

## 2019-12-10 ENCOUNTER — Other Ambulatory Visit: Payer: Self-pay | Admitting: Primary Care

## 2019-12-10 DIAGNOSIS — I10 Essential (primary) hypertension: Secondary | ICD-10-CM

## 2019-12-11 ENCOUNTER — Other Ambulatory Visit: Payer: Self-pay

## 2019-12-11 ENCOUNTER — Other Ambulatory Visit (INDEPENDENT_AMBULATORY_CARE_PROVIDER_SITE_OTHER): Payer: No Typology Code available for payment source

## 2019-12-11 DIAGNOSIS — I10 Essential (primary) hypertension: Secondary | ICD-10-CM

## 2019-12-11 LAB — CBC
HCT: 44 % (ref 39.0–52.0)
Hemoglobin: 14.8 g/dL (ref 13.0–17.0)
MCHC: 33.7 g/dL (ref 30.0–36.0)
MCV: 89.5 fl (ref 78.0–100.0)
Platelets: 321 10*3/uL (ref 150.0–400.0)
RBC: 4.92 Mil/uL (ref 4.22–5.81)
RDW: 12.8 % (ref 11.5–15.5)
WBC: 7.9 10*3/uL (ref 4.0–10.5)

## 2019-12-11 LAB — COMPREHENSIVE METABOLIC PANEL
ALT: 39 U/L (ref 0–53)
AST: 23 U/L (ref 0–37)
Albumin: 4.4 g/dL (ref 3.5–5.2)
Alkaline Phosphatase: 74 U/L (ref 39–117)
BUN: 15 mg/dL (ref 6–23)
CO2: 29 mEq/L (ref 19–32)
Calcium: 9.6 mg/dL (ref 8.4–10.5)
Chloride: 102 mEq/L (ref 96–112)
Creatinine, Ser: 1.11 mg/dL (ref 0.40–1.50)
GFR: 93.38 mL/min (ref >60.00)
Glucose, Bld: 118 mg/dL — ABNORMAL HIGH (ref 70–99)
Potassium: 3.8 mEq/L (ref 3.5–5.1)
Sodium: 139 mEq/L (ref 135–145)
Total Bilirubin: 0.6 mg/dL (ref 0.2–1.2)
Total Protein: 7 g/dL (ref 6.0–8.3)

## 2019-12-11 LAB — LIPID PANEL
Cholesterol: 164 mg/dL (ref 0–200)
HDL: 32.8 mg/dL — ABNORMAL LOW (ref >39.00)
LDL Cholesterol: 101 mg/dL — ABNORMAL HIGH (ref 0–99)
NonHDL: 131.01
Total CHOL/HDL Ratio: 5
Triglycerides: 149 mg/dL (ref 0.0–149.0)
VLDL: 29.8 mg/dL (ref 0.0–40.0)

## 2019-12-13 ENCOUNTER — Other Ambulatory Visit: Payer: Self-pay

## 2019-12-13 ENCOUNTER — Ambulatory Visit (INDEPENDENT_AMBULATORY_CARE_PROVIDER_SITE_OTHER): Payer: No Typology Code available for payment source | Admitting: Primary Care

## 2019-12-13 ENCOUNTER — Encounter: Payer: Self-pay | Admitting: Primary Care

## 2019-12-13 VITALS — BP 134/82 | HR 82 | Temp 98.6°F | Ht 76.5 in | Wt 340.0 lb

## 2019-12-13 DIAGNOSIS — R739 Hyperglycemia, unspecified: Secondary | ICD-10-CM | POA: Diagnosis not present

## 2019-12-13 DIAGNOSIS — Z0000 Encounter for general adult medical examination without abnormal findings: Secondary | ICD-10-CM | POA: Diagnosis not present

## 2019-12-13 DIAGNOSIS — M25521 Pain in right elbow: Secondary | ICD-10-CM | POA: Diagnosis not present

## 2019-12-13 DIAGNOSIS — I10 Essential (primary) hypertension: Secondary | ICD-10-CM

## 2019-12-13 DIAGNOSIS — Z23 Encounter for immunization: Secondary | ICD-10-CM

## 2019-12-13 DIAGNOSIS — G8929 Other chronic pain: Secondary | ICD-10-CM

## 2019-12-13 HISTORY — DX: Hyperglycemia, unspecified: R73.9

## 2019-12-13 LAB — POCT GLYCOSYLATED HEMOGLOBIN (HGB A1C): Hemoglobin A1C: 5.4 % (ref 4.0–5.6)

## 2019-12-13 NOTE — Patient Instructions (Addendum)
Start exercising. You should be getting 150 minutes of moderate intensity exercise weekly.  It's important to improve your diet by reducing consumption of fast food, fried food, processed snack foods, sugary drinks. Increase consumption of fresh vegetables and fruits, whole grains, water.  Ensure you are drinking 64 ounces of water daily.  Please notify me if your cough becomes bothersome.   Please schedule a follow up appointment in 6 months for blood pressure check.   It was a pleasure to see you today!   Preventive Care 23-51 Years Old, Male Preventive care refers to lifestyle choices and visits with your health care provider that can promote health and wellness. This includes:  A yearly physical exam. This is also called an annual well check.  Regular dental and eye exams.  Immunizations.  Screening for certain conditions.  Healthy lifestyle choices, such as eating a healthy diet, getting regular exercise, not using drugs or products that contain nicotine and tobacco, and limiting alcohol use. What can I expect for my preventive care visit? Physical exam Your health care provider will check:  Height and weight. These may be used to calculate body mass index (BMI), which is a measurement that tells if you are at a healthy weight.  Heart rate and blood pressure.  Your skin for abnormal spots. Counseling Your health care provider may ask you questions about:  Alcohol, tobacco, and drug use.  Emotional well-being.  Home and relationship well-being.  Sexual activity.  Eating habits.  Work and work Statistician. What immunizations do I need?  Influenza (flu) vaccine  This is recommended every year. Tetanus, diphtheria, and pertussis (Tdap) vaccine  You may need a Td booster every 10 years. Varicella (chickenpox) vaccine  You may need this vaccine if you have not already been vaccinated. Human papillomavirus (HPV) vaccine  If recommended by your health care  provider, you may need three doses over 6 months. Measles, mumps, and rubella (MMR) vaccine  You may need at least one dose of MMR. You may also need a second dose. Meningococcal conjugate (MenACWY) vaccine  One dose is recommended if you are 53-48 years old and a Market researcher living in a residence hall, or if you have one of several medical conditions. You may also need additional booster doses. Pneumococcal conjugate (PCV13) vaccine  You may need this if you have certain conditions and were not previously vaccinated. Pneumococcal polysaccharide (PPSV23) vaccine  You may need one or two doses if you smoke cigarettes or if you have certain conditions. Hepatitis A vaccine  You may need this if you have certain conditions or if you travel or work in places where you may be exposed to hepatitis A. Hepatitis B vaccine  You may need this if you have certain conditions or if you travel or work in places where you may be exposed to hepatitis B. Haemophilus influenzae type b (Hib) vaccine  You may need this if you have certain risk factors. You may receive vaccines as individual doses or as more than one vaccine together in one shot (combination vaccines). Talk with your health care provider about the risks and benefits of combination vaccines. What tests do I need? Blood tests  Lipid and cholesterol levels. These may be checked every 5 years starting at age 36.  Hepatitis C test.  Hepatitis B test. Screening   Diabetes screening. This is done by checking your blood sugar (glucose) after you have not eaten for a while (fasting).  Sexually transmitted disease (STD)  testing. Talk with your health care provider about your test results, treatment options, and if necessary, the need for more tests. Follow these instructions at home: Eating and drinking   Eat a diet that includes fresh fruits and vegetables, whole grains, lean protein, and low-fat dairy products.  Take  vitamin and mineral supplements as recommended by your health care provider.  Do not drink alcohol if your health care provider tells you not to drink.  If you drink alcohol: ? Limit how much you have to 0-2 drinks a day. ? Be aware of how much alcohol is in your drink. In the [place] one drink equals one 12 oz bottle of beer (355 mL), one 5 oz glass of wine (148 mL), or one 1 oz glass of hard liquor (44 mL). Lifestyle  Take daily care of your teeth and gums.  Stay active. Exercise for at least 30 minutes on 5 or more days each week.  Do not use any products that contain nicotine or tobacco, such as cigarettes, e-cigarettes, and chewing tobacco. If you need help quitting, ask your health care provider.  If you are sexually active, practice safe sex. Use a condom or other form of protection to prevent STIs (sexually transmitted infections). What's next?  Go to your health care provider once a year for a well check visit.  Ask your health care provider how often you should have your eyes and teeth checked.  Stay up to date on all vaccines. This information is not intended to replace advice given to you by your health care provider. Make sure you discuss any questions you have with your health care provider. Document Revised: 12/21/2017 Document Reviewed: 12/21/2017 Elsevier Patient Education  [place] Maintenance Due  Topic Date Due  . Hepatitis C Screening  Will get at next labs  Never done  . HIV Screening  Will get at next labs  Never done  . INFLUENZA VACCINE  Getting in office today  08/11/2019    Influenza (Flu) Vaccine (Inactivated or Recombinant): What You Need to Know 1. Why get vaccinated? Influenza vaccine can prevent influenza (flu). Flu is a contagious disease that spreads around the [place] every year, usually between October and May. Anyone can get the flu, but it is more dangerous for some people. Infants and young children,  people 34 years of age and older, pregnant women, and people with certain health conditions or a weakened immune system are at greatest risk of flu complications. Pneumonia, bronchitis, sinus infections and ear infections are examples of flu-related complications. If you have a medical condition, such as heart disease, cancer or diabetes, flu can make it worse. Flu can cause fever and chills, sore throat, muscle aches, fatigue, cough, headache, and runny or stuffy nose. Some people may have vomiting and diarrhea, though this is more common in children than adults. Each year thousands of people in the [place] die from flu, and many more are hospitalized. Flu vaccine prevents millions of illnesses and flu-related visits to the doctor each year. 2. Influenza vaccine CDC recommends everyone 64 months of age and older get vaccinated every flu season. Children 6 months through 56 years of age may need 2 doses during a single flu season. Everyone else needs only 1 dose each flu season. It takes about 2 weeks for protection to develop after vaccination. There are many flu viruses, and they are always changing. Each year a  new flu vaccine is made to protect against three or four viruses that are likely to cause disease in the upcoming flu season. Even when the vaccine doesn't exactly match these viruses, it may still provide some protection. Influenza vaccine does not cause flu. Influenza vaccine may be given at the same time as other vaccines. 3. Talk with your health care provider Tell your vaccine provider if the person getting the vaccine:  Has had an allergic reaction after a previous dose of influenza vaccine, or has any severe, life-threatening allergies.  Has ever had Guillain-Barr Syndrome (also called GBS). In some cases, your health care provider may decide to postpone influenza vaccination to a future visit. People with minor illnesses, such as a cold, may be vaccinated. People who are  moderately or severely ill should usually wait until they recover before getting influenza vaccine. Your health care provider can give you more information. 4. Risks of a vaccine reaction  Soreness, redness, and swelling where shot is given, fever, muscle aches, and headache can happen after influenza vaccine.  There may be a very small increased risk of Guillain-Barr Syndrome (GBS) after inactivated influenza vaccine (the flu shot). Young children who get the flu shot along with pneumococcal vaccine (PCV13), and/or DTaP vaccine at the same time might be slightly more likely to have a seizure caused by fever. Tell your health care provider if a child who is getting flu vaccine has ever had a seizure. People sometimes faint after medical procedures, including vaccination. Tell your provider if you feel dizzy or have vision changes or ringing in the ears. As with any medicine, there is a very remote chance of a vaccine causing a severe allergic reaction, other serious injury, or death. 5. What if there is a serious problem? An allergic reaction could occur after the vaccinated person leaves the clinic. If you see signs of a severe allergic reaction (hives, swelling of the face and throat, difficulty breathing, a fast heartbeat, dizziness, or weakness), call 9-1-1 and get the person to the nearest hospital. For other signs that concern you, call your health care provider. Adverse reactions should be reported to the Vaccine Adverse Event Reporting System (VAERS). Your health care provider will usually file this report, or you can do it yourself. Visit the VAERS website at www.vaers.SamedayNews.es or call 646-641-1319.VAERS is only for reporting reactions, and VAERS staff do not give medical advice. 6. The National Vaccine Injury Compensation Program The [employer] Vaccine Injury Compensation Program (VICP) is a federal program that was created to compensate people who may have been injured by certain vaccines.  Visit the VICP website at GoldCloset.com.ee or call (774)564-3643 to learn about the program and about filing a claim. There is a time limit to file a claim for compensation. 7. How can I learn more?  Ask your healthcare provider.  Call your local or state health department.  Contact the Centers for Disease Control and Prevention (CDC): ? Call 802-259-8636 (1-800-CDC-INFO) or ? Visit CDC's https://gibson.com/ Vaccine Information Statement (Interim) Inactivated Influenza Vaccine (08/24/2017) This information is not intended to replace advice given to you by your health care provider. Make sure you discuss any questions you have with your health care provider. Document Revised: 04/17/2018 Document Reviewed: 08/28/2017 Elsevier Patient Education  [place].    Recommended follow up: No follow-ups on file.

## 2019-12-13 NOTE — Progress Notes (Signed)
Subjective:    Patient ID: Cody Gill, male    DOB: 14-Nov-1995, 24 y.o.   MRN: [id number]  HPI  This visit occurred during the SARS-CoV-2 public health emergency.  Safety protocols were in place, including screening questions prior to the visit, additional usage of staff PPE, and extensive cleaning of exam room while observing appropriate contact time as indicated for disinfecting solutions.   Cody Gill is a 24 year old male who presents today for complete physical.  Immunizations: -Tetanus: Completed in 2018 -Influenza: Completed today -Covid-19: Did not receive.  -HPV: Completed series  Diet: He endorses a poor diet, fast food/take out food, junk food. Exercise: He is active during the day. No regular exercise.  Eye exam: No recent exam Dental exam: Completes semi-annually   BP Readings from Last 3 Encounters:  12/13/19 134/82  12/10/18 120/76  08/02/18 120/64   Wt Readings from Last 3 Encounters:  12/13/19 (!) 340 lb (154.2 kg)  12/10/18 (!) 330 lb (149.7 kg)  08/02/18 (!) 322 lb 8 oz (146.3 kg)     Review of Systems  Constitutional: Negative for unexpected weight change.  HENT: Negative for rhinorrhea.   Eyes: Negative for visual disturbance.  Respiratory: Negative for shortness of breath.        Intermittent ACE-I induced cough  Cardiovascular: Negative for chest pain.  Gastrointestinal: Negative for constipation and diarrhea.  Endocrine: Negative for polyuria.  Genitourinary: Negative for difficulty urinating.  Musculoskeletal: Positive for arthralgias. Negative for myalgias.  Skin: Negative for rash.  Allergic/Immunologic: Negative for environmental allergies.  Neurological: Negative for dizziness, numbness and headaches.  Psychiatric/Behavioral: The patient is not nervous/anxious.        Past Medical History:  Diagnosis Date  . Acne vulgaris   . Elevated blood pressure reading      Social History   Socioeconomic History  . Marital status:  Single    Spouse name: Not on file  . Number of children: Not on file  . Years of education: Not on file  . Highest education level: Not on file  Occupational History  . Not on file  Tobacco Use  . Smoking status: Never Smoker  . Smokeless tobacco: Never Used  Substance and Sexual Activity  . Alcohol use: No  . Drug use: No  . Sexual activity: Not on file  Other Topics Concern  . Not on file  Social History Narrative   Single.   Works as a Games developer.   Enjoys working on cars.    Social Determinants of Health   Financial Resource Strain:   . Difficulty of Paying Living Expenses: Not on file  Food Insecurity:   . Worried About Programme researcher, broadcasting/film/video in the Last Year: Not on file  . Ran Out of Food in the Last Year: Not on file  Transportation Needs:   . Lack of Transportation (Medical): Not on file  . Lack of Transportation (Non-Medical): Not on file  Physical Activity:   . Days of Exercise per Week: Not on file  . Minutes of Exercise per Session: Not on file  Stress:   . Feeling of Stress : Not on file  Social Connections:   . Frequency of Communication with Friends and Family: Not on file  . Frequency of Social Gatherings with Friends and Family: Not on file  . Attends Religious Services: Not on file  . Active Member of Clubs or Organizations: Not on file  . Attends Banker Meetings: Not on  file  . Marital Status: Not on file  Intimate Partner Violence:   . Fear of Current or Ex-Partner: Not on file  . Emotionally Abused: Not on file  . Physically Abused: Not on file  . Sexually Abused: Not on file    History reviewed. No pertinent surgical history.  Family History  Problem Relation Age of Onset  . Hypertension Mother   . Hypertension Father   . Hypertension Maternal Grandmother   . Diabetes Maternal Grandmother     No Known Allergies  Current Outpatient Medications on File Prior to Visit  Medication Sig Dispense Refill  . cetirizine  (ZYRTEC) 10 MG tablet Take 10 mg by mouth daily.    . clindamycin-benzoyl peroxide (BENZACLIN) gel APPLY 1 APPLICATION TOPICALLY 2 (TWO) TIMES DAILY 50 g 0  . lisinopril (ZESTRIL) 20 MG tablet TAKE 1 TABLET BY MOUTH EVERY DAY 90 tablet 1   No current facility-administered medications on file prior to visit.    BP 134/82   Pulse 82   Temp 98.6 F (37 C) (Temporal)   Ht 6' 4.5" (1.943 m)   Wt (!) 340 lb (154.2 kg)   SpO2 96%   BMI 40.85 kg/m    Objective:   Physical Exam HENT:     Right Ear: Tympanic membrane and ear canal normal.     Left Ear: Tympanic membrane and ear canal normal.  Eyes:     Pupils: Pupils are equal, round, and reactive to light.  Cardiovascular:     Rate and Rhythm: Normal rate and regular rhythm.  Pulmonary:     Effort: Pulmonary effort is normal.     Breath sounds: Normal breath sounds.  Abdominal:     General: Bowel sounds are normal.     Palpations: Abdomen is soft.     Tenderness: There is no abdominal tenderness.  Musculoskeletal:        General: Normal range of motion.     Cervical back: Neck supple.  Skin:    General: Skin is warm and dry.  Neurological:     Mental Status: He is alert and oriented to person, place, and time.     Cranial Nerves: No cranial nerve deficit.     Deep Tendon Reflexes:     Reflex Scores:      Patellar reflexes are 2+ on the right side and 2+ on the left side. Psychiatric:        Mood and Affect: Mood normal.            Assessment & Plan:

## 2019-12-13 NOTE — Assessment & Plan Note (Signed)
Above goal in the office today, also with 10 pound weight gain since last year. Poor diet, no exercise.  Long discussion today regarding the need for weight loss. He verbalized understanding.  He does have an intermittent ACE-I induced cough. Offered to change from lisinopril to another medication, he declines but will notify if cough becomes bothersome.  Continue lisinopril 20 mg for now. CMP reviewed.

## 2019-12-13 NOTE — Assessment & Plan Note (Signed)
Chronic, no worse. Discussed sports medicine evaluation.

## 2019-12-13 NOTE — Assessment & Plan Note (Signed)
Immunizations UTD. Flu shot provided today.  Discussed the importance of a healthy diet and regular exercise in order for weight loss, and to reduce the risk of any potential medical problems.  Exam today unremarkable. Labs reviewed.

## 2019-12-13 NOTE — Assessment & Plan Note (Signed)
Glucose of 118, non fasting. A1C today of 5.4.

## 2020-02-08 ENCOUNTER — Other Ambulatory Visit: Payer: Self-pay | Admitting: Primary Care

## 2020-02-08 DIAGNOSIS — L700 Acne vulgaris: Secondary | ICD-10-CM

## 2020-02-10 NOTE — Telephone Encounter (Signed)
Refills sent to pharmacy. 

## 2020-02-10 NOTE — Telephone Encounter (Signed)
Do you refill?

## 2020-03-23 ENCOUNTER — Other Ambulatory Visit: Payer: Self-pay | Admitting: Primary Care

## 2020-03-23 DIAGNOSIS — I10 Essential (primary) hypertension: Secondary | ICD-10-CM

## 2020-05-04 ENCOUNTER — Emergency Department (HOSPITAL_COMMUNITY): Payer: PRIVATE HEALTH INSURANCE

## 2020-05-04 ENCOUNTER — Emergency Department (HOSPITAL_COMMUNITY)
Admission: EM | Admit: 2020-05-04 | Discharge: 2020-05-04 | Disposition: A | Discharge status: Home / Self Care | Payer: PRIVATE HEALTH INSURANCE | Attending: Emergency Medicine | Admitting: Emergency Medicine

## 2020-05-04 DIAGNOSIS — R200 Anesthesia of skin: Secondary | ICD-10-CM | POA: Diagnosis not present

## 2020-05-04 DIAGNOSIS — M5410 Radiculopathy, site unspecified: Secondary | ICD-10-CM | POA: Diagnosis not present

## 2020-05-04 DIAGNOSIS — M549 Dorsalgia, unspecified: Secondary | ICD-10-CM | POA: Diagnosis present

## 2020-05-04 MED ORDER — PREDNISONE 10 MG PO TABS: 20.0000 mg | ORAL_TABLET | Freq: Every day | ORAL | 0 refills | Status: DC

## 2020-05-04 MED ORDER — CYCLOBENZAPRINE HCL 10 MG PO TABS: 10.0000 mg | ORAL_TABLET | Freq: Two times a day (BID) | ORAL | 0 refills | Status: DC | PRN

## 2020-05-04 MED ORDER — IBUPROFEN 600 MG PO TABS: 600.0000 mg | ORAL_TABLET | Freq: Four times a day (QID) | ORAL | 0 refills | Status: DC | PRN

## 2020-05-04 NOTE — ED Provider Notes (Signed)
[facility] EMERGENCY DEPARTMENT Provider Note   CSN: [id number] Arrival date & time: 05/04/20  1347     History Chief Complaint  Patient presents with  . Back Pain    Cody Gill is a 25 y.o. male.  The history is provided by the patient and medical records. No language interpreter was used.  Back Pain Associated symptoms: numbness   Associated symptoms: no fever      25 year old male presenting for evaluation of back pain.  Patient report for the past 2 months he has had intermittent back pain.  Pain is to his lower back, sometimes in sharp shooting and radiates down to both legs causing tenderness and numbness sensation to his legs.  Sometimes it could be intense even with simple walking up the steps.  Pain happened today while he was walking and his leg gave out causing him to fall down.  He denies any significant injury from that.  He report pain is minimal at this time.  No fever chills no abdominal pain no dysuria hematuria no bowel bladder incontinence or saddle anesthesia.  No history of IV drug use or active cancer.  Mention been seen by [facility] 2 months ago and had an x-ray of his back that was unremarkable.  He admits to perform heavy lifting at work.  Past Medical History:  Diagnosis Date  . Acne vulgaris   . Elevated blood pressure reading     Patient Active Problem List   Diagnosis Date Noted  . Hyperglycemia 12/13/2019  . 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 Preventative health care 11/28/2016  . Acne vulgaris 11/26/2015  . Essential hypertension 11/26/2015    No past surgical history on file.     Family History  Problem Relation Age of Onset  . Hypertension Mother   . Hypertension Father   . Hypertension Maternal Grandmother   . Diabetes Maternal Grandmother     Social History   Tobacco Use  . Smoking status: Never Smoker  . Smokeless tobacco: Never Used  Substance Use Topics  . Alcohol use: No  . Drug use: No    Home  Medications Prior to Admission medications   Medication Sig Start Date End Date Taking? Authorizing Provider  cetirizine (ZYRTEC) 10 MG tablet Take 10 mg by mouth daily.    [provider]  clindamycin-benzoyl peroxide (BENZACLIN) gel APPLY 1 APPLICATION TO AFFECTED 2 TIMES A DAY 02/10/20   Doreene Nest, NP  lisinopril (ZESTRIL) 20 MG tablet TAKE 1 TABLET BY MOUTH EVERY DAY 03/23/20   Doreene Nest, NP    Allergies    Patient has no known allergies.  Review of Systems   Review of Systems  Constitutional: Negative for fever.  Musculoskeletal: Positive for back pain.  Skin: Negative for rash and wound.  Neurological: Positive for numbness.    Physical Exam Updated Vital Signs BP (!) 143/86 (BP Location: Left Arm)   Pulse 97   Temp 99.4 F (37.4 C) (Oral)   Resp 15   SpO2 97%   Physical Exam Vitals and nursing note reviewed.  Constitutional:      General: He is not in acute distress.    Appearance: He is well-developed. He is obese.  HENT:     Head: Atraumatic.  Eyes:     Conjunctiva/sclera: Conjunctivae normal.  Abdominal:     Palpations: Abdomen is soft.     Tenderness: There is no abdominal tenderness.  Musculoskeletal:  General: Tenderness (Tenderness to midline lumbar spine on palpation without any crepitus or step-off.  Increasing pain with bilateral hip raise but range of motion is intact.) present.     Cervical back: Neck supple.  Skin:    Capillary Refill: Capillary refill takes less than 2 seconds.     Findings: No rash.  Neurological:     Mental Status: He is alert.     Deep Tendon Reflexes: Reflexes normal.     Comments: Able to ambulate.  Psychiatric:        Mood and Affect: Mood normal.     ED Results / Procedures / Treatments   Labs (all labs ordered are listed, but only abnormal results are displayed) Labs Reviewed - No data to display  EKG None  Radiology DG Lumbar Spine Complete  Result Date: 05/04/2020 CLINICAL  DATA:  25 year old male with low back pain. EXAM: LUMBAR SPINE - COMPLETE 4+ VIEW COMPARISON:  None. FINDINGS: Five lumbar type vertebra. There is no acute fracture or subluxation of the lumbar spine. Probable grade 1 L5-S1 retrolisthesis. Evaluation is limited due to overlying soft tissues. The vertebral body heights and disc spaces are maintained. The visualized posterior elements are intact. The soft tissues are unremarkable. IMPRESSION: No acute/traumatic lumbar spine pathology. Electronically Signed   By: Elgie Collard M.D.   On: 05/04/2020 15:11    Procedures Procedures   Medications Ordered in ED Medications - No data to display  ED Course  I have reviewed the triage vital signs and the nursing notes.  Pertinent labs & imaging results that were available during my care of the patient were reviewed by me and considered in my medical decision making (see chart for details).    MDM Rules/Calculators/A&P                          BP (!) 143/86 (BP Location: Left Arm)   Pulse 97   Temp 99.4 F (37.4 C) (Oral)   Resp 15   SpO2 97%   Final Clinical Impression(s) / ED Diagnoses Final diagnoses:  Radicular low back pain    Rx / DC Orders ED Discharge Orders    None     4:45 PM Patient here with 3 radicular back pain, recurrent for the past 2 months.  Does perform heavy lifting at work.  X-ray today unremarkable.  I suspect this is likely to be a pinched nerve from a slipped disc.  I have low suspicion for cauda equina.  No red flags.  Will prescribe steroid, muscle relaxant, anti-inflammatory medication and recommend patient to follow-up with orthopedist for further care.  He may benefit from advanced imaging such as MRI if indicated.   Fayrene Helper, PA-C 05/07/20 2330    Koleen Distance, MD 05/12/20 [phone]

## 2020-05-04 NOTE — ED Triage Notes (Signed)
Emergency Medicine Provider Triage Evaluation Note  Cody Gill , a 25 y.o. male  was evaluated in triage.  Pt complains of low back pain x2 months. He states pain was so severe today that his legs gave out and his feel to his knees. Intermittent bilateral lower extremity numbness/tingling. No weakness. Denies fever/chills and IV drug use. Admits to occasional numbness/tingling to testicles. He has been taking a muscle relaxer with moderate relief.  Review of Systems  Positive: Low back pain Negative: fever  Physical Exam  There were no vitals taken for this visit. Gen:   Awake, no distress   HEENT:  Atraumatic  Resp:  Normal effort  Cardiac:  Normal rate  Abd:   Nondistended, nontender  MSK:   TTP in lumbar region Neuro:  Speech clear   Medical Decision Making  Medically screening exam initiated at 2:04 PM.  Appropriate orders placed.  Cody Gill was informed that the remainder of the evaluation will be completed by another provider, this initial triage assessment does not replace that evaluation, and the importance of remaining in the ED until their evaluation is complete.  Clinical Impression  X-ray ordered.   Mannie Stabile, [place] 05/04/20 1415

## 2020-05-04 NOTE — ED Triage Notes (Signed)
Pt reports back pain x2 months.Pt reports bilateral leg numbness.

## 2020-05-04 NOTE — ED Notes (Signed)
Discharge instructions discussed with pt. Pt was able to verbalize understanding with no questions at this time.

## 2020-05-11 ENCOUNTER — Encounter: Payer: Self-pay | Admitting: Family Medicine

## 2020-05-11 ENCOUNTER — Ambulatory Visit: Payer: No Typology Code available for payment source | Admitting: Family Medicine

## 2020-05-11 ENCOUNTER — Other Ambulatory Visit: Payer: Self-pay

## 2020-05-11 DIAGNOSIS — G8929 Other chronic pain: Secondary | ICD-10-CM | POA: Diagnosis not present

## 2020-05-11 DIAGNOSIS — M545 Low back pain, unspecified: Secondary | ICD-10-CM

## 2020-05-11 DIAGNOSIS — M5441 Lumbago with sciatica, right side: Secondary | ICD-10-CM | POA: Diagnosis not present

## 2020-05-11 DIAGNOSIS — M5442 Lumbago with sciatica, left side: Secondary | ICD-10-CM

## 2020-05-11 HISTORY — DX: Low back pain, unspecified: M54.50

## 2020-05-11 NOTE — Patient Instructions (Signed)
I placed a referral to physical therapy  Glad you are doing better  Heat/ice can help  Take a look at the exercises   You will get a call

## 2020-05-11 NOTE — Assessment & Plan Note (Signed)
Low back pain -intermittent with radiation to R leg  Improved after prednisone, nsaid, muscle relaxer Reassuring LS xray report  Has seen orthopedics  Disc effort to prevent re occurrence  Ref made for PT (eval and treat) nsaid prn/ ice heat  Also given handout re: stretches inst to update if worse or not improved

## 2020-05-11 NOTE — Progress Notes (Signed)
Subjective:    Patient ID: Cody Gill, male    DOB: 10-27-1995, 25 y.o.   MRN: [id number]  This visit occurred during the SARS-CoV-2 public health emergency.  Safety protocols were in place, including screening questions prior to the visit, additional usage of staff PPE, and extensive cleaning of exam room while observing appropriate contact time as indicated for disinfecting solutions.    HPI 25 yo pt of NP [facility] presents for f/u of ED visit for back pain   Wt Readings from Last 3 Encounters:  05/11/20 (!) 347 lb (157.4 kg)  12/13/19 (!) 340 lb (154.2 kg)  12/10/18 (!) 330 lb (149.7 kg)   41.96 kg/m  She was seen on 05/04/20 for low back pain that caused her to fall  This had been intermittent in the past  Noted she was seen at [facility] 2 mo ago and had unremarkable xr Xray done at ER was as follows  DG Lumbar Spine Complete  Result Date: 05/04/2020 CLINICAL DATA:  25 year old male with low back pain. EXAM: LUMBAR SPINE - COMPLETE 4+ VIEW COMPARISON:  None. FINDINGS: Five lumbar type vertebra. There is no acute fracture or subluxation of the lumbar spine. Probable grade 1 L5-S1 retrolisthesis. Evaluation is limited due to overlying soft tissues. The vertebral body heights and disc spaces are maintained. The visualized posterior elements are intact. The soft tissues are unremarkable. IMPRESSION: No acute/traumatic lumbar spine pathology. Electronically Signed   By: Elgie Collard M.D.   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 Low back pain 05/11/2020  . Hyperglycemia 12/13/2019  . 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 Preventative health care 11/28/2016  . Acne vulgaris 11/26/2015  . Essential hypertension 11/26/2015   Past Medical History:  Diagnosis Date  . Acne vulgaris   . Elevated blood pressure reading    History reviewed. No pertinent surgical history. Social History   Tobacco Use  . Smoking status: Never Smoker  . Smokeless tobacco: Never Used  Substance Use Topics  . Alcohol use: No  . Drug use: No   Family History  Problem Relation Age of Onset  . Hypertension Mother   . Hypertension Father   . Hypertension Maternal Grandmother   . Diabetes Maternal Grandmother    No Known Allergies Current Outpatient Medications on File Prior to Visit  Medication Sig Dispense Refill  . cetirizine (ZYRTEC) 10 MG tablet Take 10 mg by mouth daily.    . clindamycin-benzoyl peroxide (BENZACLIN) gel APPLY 1 APPLICATION TO AFFECTED 2 TIMES A DAY 50  g 1  . cyclobenzaprine (FLEXERIL) 10 MG tablet Take 1 tablet (10 mg total) by mouth 2 (two) times daily as needed for muscle spasms. 20 tablet 0  . ibuprofen (ADVIL) 600 MG tablet Take 1 tablet (600 mg total) by mouth every 6 (six) hours as needed. 30 tablet 0  . lisinopril (ZESTRIL) 20 MG tablet TAKE 1 TABLET BY MOUTH EVERY DAY 90 tablet 1  . methocarbamol (ROBAXIN) 500 MG tablet methocarbamol 500 mg tablet  TAKE 1 TABLET BY MOUTH TWICE A DAY AS NEEDED FOR 15 DAYS     No current facility-administered medications on file prior to visit.    Review of Systems  Constitutional: Negative for activity change, appetite change, fatigue, fever and unexpected weight  change.  HENT: Negative for congestion, rhinorrhea, sore throat and trouble swallowing.   Eyes: Negative for pain, redness, itching and visual disturbance.  Respiratory: Negative for cough, chest tightness, shortness of breath and wheezing.   Cardiovascular: Negative for chest pain and palpitations.  Gastrointestinal: Negative for abdominal pain, blood in stool, constipation, diarrhea and nausea.  Endocrine: Negative for cold intolerance, heat intolerance, polydipsia and polyuria.  Genitourinary: Negative for difficulty urinating, dysuria, frequency and urgency.  Musculoskeletal: Positive for back pain. Negative for arthralgias, joint swelling and myalgias.  Skin: Negative for pallor and rash.  Neurological: Negative for dizziness, tremors, weakness, numbness and headaches.  Hematological: Negative for adenopathy. Does not bruise/bleed easily.  Psychiatric/Behavioral: Negative for decreased concentration and dysphoric mood. The patient is not nervous/anxious.        Objective:   Physical Exam Constitutional:      General: He is not in acute distress.    Appearance: Normal appearance. He is well-developed. He is obese. He is not ill-appearing.  HENT:     Head: Normocephalic and atraumatic.  Eyes:     General: No scleral icterus.    Conjunctiva/sclera: Conjunctivae normal.     Pupils: Pupils are equal, round, and reactive to light.  Cardiovascular:     Rate and Rhythm: Normal rate and regular rhythm.  Pulmonary:     Effort: Pulmonary effort is normal.     Breath sounds: Normal breath sounds. No wheezing or rales.  Abdominal:     General: Bowel sounds are normal. There is no distension.     Palpations: Abdomen is soft.     Tenderness: There is no abdominal tenderness.  Musculoskeletal:        General: Tenderness present.     Cervical back: Normal range of motion and neck supple.     Lumbar back: Spasms and tenderness present. No swelling, edema, lacerations or bony tenderness.  Decreased range of motion. Negative left straight leg raise test. No scoliosis.     Right lower leg: No edema.     Left lower leg: No edema.     Comments: Mild tenderness R lumbar musculature  Neg bent knee raise  Nl rom of hips LS flex to 80 deg with pain  Ext-nl , nl lateral bend bilat  Nl gait   Lymphadenopathy:     Cervical: No cervical adenopathy.  Skin:    General: Skin is warm and dry.     Coloration: Skin is not pale.     Findings: No erythema or rash.  Neurological:     Mental Status: He is alert.     Cranial Nerves: No cranial nerve deficit.     Sensory: No sensory deficit.     Motor: No atrophy or abnormal muscle tone.  Coordination: Coordination normal.     Deep Tendon Reflexes: Reflexes are normal and symmetric. Reflexes normal.     Comments: Negative SLR  Psychiatric:        Mood and Affect: Mood normal.           Assessment & Plan:   Problem List Items Addressed This Visit      Other   Low back pain    Low back pain -intermittent with radiation to R leg  Improved after prednisone, nsaid, muscle relaxer Reassuring LS xray report  Has seen orthopedics  Disc effort to prevent re occurrence  Ref made for PT (eval and treat) nsaid prn/ ice heat  Also given handout re: stretches inst to update if worse or not improved      Relevant Medications   methocarbamol (ROBAXIN) 500 MG tablet   Other Relevant Orders   Ambulatory referral to Physical Therapy

## 2020-06-12 ENCOUNTER — Ambulatory Visit: Payer: No Typology Code available for payment source | Admitting: Primary Care

## 2020-06-19 ENCOUNTER — Ambulatory Visit: Payer: No Typology Code available for payment source | Admitting: Primary Care

## 2020-06-19 ENCOUNTER — Other Ambulatory Visit: Payer: Self-pay

## 2020-06-19 VITALS — BP 122/74 | HR 76 | Temp 98.3°F | Ht 76.0 in | Wt 347.0 lb

## 2020-06-19 DIAGNOSIS — J029 Acute pharyngitis, unspecified: Secondary | ICD-10-CM | POA: Diagnosis not present

## 2020-06-19 DIAGNOSIS — I10 Essential (primary) hypertension: Secondary | ICD-10-CM | POA: Diagnosis not present

## 2020-06-19 MED ORDER — OMEPRAZOLE 20 MG PO CPDR: 20.0000 mg | DELAYED_RELEASE_CAPSULE | Freq: Every day | ORAL | 0 refills | Status: DC

## 2020-06-19 NOTE — Patient Instructions (Addendum)
Start taking omeprazole 20 mg at bedtime for reflux.  Continue Zyrtec.   Please update me in 2 weeks as discussed.  Please schedule a physical to meet with me in December 2022.   It was a pleasure to see you today!

## 2020-06-19 NOTE — Progress Notes (Signed)
Subjective:    Patient ID: Cody Gill, male    DOB: 26-Dec-1995, 25 y.o.   MRN: [id number]  HPI  Cody Gill is a very pleasant 25 y.o. male with a history of hypertension who presents today for follow up of hypertension and to discuss sore throat.  He was last evaluated in December 2021 for CPE, an ACE-I induced cough was noted, is on lisinopril 20 mg, he wasn't bothered by this at the time.   He hasn't worked much on diet and exercise, is working on portion control. He continues to notice an occasional cough which is not bothersome. Compliant to lisinopril daily.  Today he mentions a sore throat for the last 2 months. Evaluated at [facility] at the time, was provided with liquid medication and Ibuprofen. He describes a "raw" feeling for which he notices in the morning mostly, improved throughout the day. He does have chronic post nasal drip and is managed on Zyrtec 10 mg daily. He denies esophageal burning, belching, abdominal pain during the day.     BP Readings from Last 3 Encounters:  06/19/20 122/74  05/11/20 135/80  05/04/20 127/80    Wt Readings from Last 3 Encounters:  06/19/20 (!) 347 lb (157.4 kg)  05/11/20 (!) 347 lb (157.4 kg)  12/13/19 (!) 340 lb (154.2 kg)       Review of Systems  Constitutional:  Negative for fever.  HENT:  Positive for postnasal drip and sore throat.   Respiratory:  Negative for shortness of breath.   Cardiovascular:  Negative for chest pain.  Gastrointestinal:        See HPI        Past Medical History:  Diagnosis Date   Acne vulgaris    Elevated blood pressure reading     Social History   Socioeconomic History   Marital status: Single    Spouse name: Not on file   Number of children: Not on file   Years of education: Not on file   Highest education level: Not on file  Occupational History   Not on file  Tobacco Use   Smoking status: Never   Smokeless tobacco: Never  Substance and Sexual Activity    Alcohol use: No   Drug use: No   Sexual activity: Not on file  Other Topics Concern   Not on file  Social History Narrative   Single.   Works as a Games developer.   Enjoys working on cars.    Social Determinants of Health   Financial Resource Strain: Not on file  Food Insecurity: Not on file  Transportation Needs: Not on file  Physical Activity: Not on file  Stress: Not on file  Social Connections: Not on file  Intimate Partner Violence: Not on file    No past surgical history on file.  Family History  Problem Relation Age of Onset   Hypertension Mother    Hypertension Father    Hypertension Maternal Grandmother    Diabetes Maternal Grandmother     No Known Allergies  Current Outpatient Medications on File Prior to Visit  Medication Sig Dispense Refill   cetirizine (ZYRTEC) 10 MG tablet Take 10 mg by mouth daily.     clindamycin-benzoyl peroxide (BENZACLIN) gel APPLY 1 APPLICATION TO AFFECTED 2 TIMES A DAY 50 g 1   lisinopril (ZESTRIL) 20 MG tablet TAKE 1 TABLET BY MOUTH EVERY DAY 90 tablet 1   cyclobenzaprine (FLEXERIL) 10 MG tablet Take 1 tablet (10 mg total)  by mouth 2 (two) times daily as needed for muscle spasms. (Patient not taking: Reported on 06/19/2020) 20 tablet 0   methocarbamol (ROBAXIN) 500 MG tablet methocarbamol 500 mg tablet  TAKE 1 TABLET BY MOUTH TWICE A DAY AS NEEDED FOR 15 DAYS (Patient not taking: Reported on 06/19/2020)     No current facility-administered medications on file prior to visit.    BP 122/74   Pulse 76   Temp 98.3 F (36.8 C) (Temporal)   Ht 6\' 4"  (1.93 m)   Wt (!) 347 lb (157.4 kg)   SpO2 97%   BMI 42.24 kg/m  Objective:   Physical Exam HENT:     Mouth/Throat:     Mouth: Mucous membranes are moist.     Pharynx: Oropharynx is clear. No oropharyngeal exudate.  Cardiovascular:     Rate and Rhythm: Normal rate and regular rhythm.  Pulmonary:     Effort: Pulmonary effort is normal.     Breath sounds: Normal breath sounds.  No wheezing or rales.  Musculoskeletal:     Cervical back: Neck supple.  Skin:    General: Skin is warm and dry.  Neurological:     Mental Status: He is alert and oriented to person, place, and time.          Assessment & Plan:      This visit occurred during the SARS-CoV-2 public health emergency.  Safety protocols were in place, including screening questions prior to the visit, additional usage of staff PPE, and extensive cleaning of exam room while observing appropriate contact time as indicated for disinfecting solutions.

## 2020-06-19 NOTE — Assessment & Plan Note (Addendum)
Acute for 2 months, some improvement over time.  Suspect either post nasal drip or GERD. Given that he's on Zyrtec already, will trial omeprazole 20 mg HS. He will update in a few weeks. If no improvement then consider switching Zyrtec to Xyzal.  Discussed GERD triggers (food and drink) and to avoid laying flat within 2 hours from eating.

## 2020-06-19 NOTE — Assessment & Plan Note (Signed)
Well controlled on lisinopril 20 mg, continue same.

## 2020-07-24 ENCOUNTER — Other Ambulatory Visit: Payer: Self-pay | Admitting: Primary Care

## 2020-07-24 DIAGNOSIS — J029 Acute pharyngitis, unspecified: Secondary | ICD-10-CM

## 2020-07-24 NOTE — Telephone Encounter (Signed)
Left message to return call to our office.  Was given 30 day supply to see if helped with cough. Need to call patient and see how he is dong.

## 2020-07-27 NOTE — Telephone Encounter (Signed)
Left message to return call to our office.  

## 2020-07-27 NOTE — Telephone Encounter (Signed)
Pt called back returning your call °

## 2020-07-28 NOTE — Telephone Encounter (Signed)
Called and spoke to mom states he is not taking because it did not help with cough. He took until this week.

## 2020-07-28 NOTE — Telephone Encounter (Signed)
Please notify patient and mom:  I suspect the cough is secondary to his lisinopril blood pressure medication, he and I discussed this during his last visit. I wanted to change his BP medication but he wasn't interested.  If he'd like to switch then I am happy to do so. I recommend we try as I do believe that his BP medication is causing the cough.

## 2020-07-29 NOTE — Telephone Encounter (Signed)
Left message to return call to our office.  

## 2020-08-05 NOTE — Telephone Encounter (Signed)
Left message to return call to our office.  

## 2020-08-17 NOTE — Telephone Encounter (Signed)
Called patient l/m to call office x 3 no call back. Please advise.  °

## 2020-09-20 ENCOUNTER — Other Ambulatory Visit: Payer: Self-pay | Admitting: Primary Care

## 2020-09-20 DIAGNOSIS — I10 Essential (primary) hypertension: Secondary | ICD-10-CM

## 2020-12-17 ENCOUNTER — Other Ambulatory Visit: Payer: Self-pay | Admitting: Primary Care

## 2020-12-17 DIAGNOSIS — I10 Essential (primary) hypertension: Secondary | ICD-10-CM

## 2020-12-24 ENCOUNTER — Other Ambulatory Visit: Payer: Self-pay

## 2020-12-24 ENCOUNTER — Ambulatory Visit (INDEPENDENT_AMBULATORY_CARE_PROVIDER_SITE_OTHER): Payer: No Typology Code available for payment source | Admitting: Primary Care

## 2020-12-24 VITALS — BP 120/62 | HR 97 | Temp 98.3°F | Ht 77.0 in | Wt 353.0 lb

## 2020-12-24 DIAGNOSIS — Z1159 Encounter for screening for other viral diseases: Secondary | ICD-10-CM | POA: Diagnosis not present

## 2020-12-24 DIAGNOSIS — Z114 Encounter for screening for human immunodeficiency virus [HIV]: Secondary | ICD-10-CM

## 2020-12-24 DIAGNOSIS — I10 Essential (primary) hypertension: Secondary | ICD-10-CM | POA: Diagnosis not present

## 2020-12-24 DIAGNOSIS — G8929 Other chronic pain: Secondary | ICD-10-CM

## 2020-12-24 DIAGNOSIS — R739 Hyperglycemia, unspecified: Secondary | ICD-10-CM

## 2020-12-24 DIAGNOSIS — L700 Acne vulgaris: Secondary | ICD-10-CM

## 2020-12-24 DIAGNOSIS — M25521 Pain in right elbow: Secondary | ICD-10-CM

## 2020-12-24 DIAGNOSIS — Z0000 Encounter for general adult medical examination without abnormal findings: Secondary | ICD-10-CM | POA: Diagnosis not present

## 2020-12-24 DIAGNOSIS — Z23 Encounter for immunization: Secondary | ICD-10-CM | POA: Diagnosis not present

## 2020-12-24 LAB — COMPREHENSIVE METABOLIC PANEL
ALT: 32 U/L (ref 0–53)
AST: 22 U/L (ref 0–37)
Albumin: 4.3 g/dL (ref 3.5–5.2)
Alkaline Phosphatase: 82 U/L (ref 39–117)
BUN: 14 mg/dL (ref 6–23)
CO2: 29 mEq/L (ref 19–32)
Calcium: 9.9 mg/dL (ref 8.4–10.5)
Chloride: 103 mEq/L (ref 96–112)
Creatinine, Ser: 1.1 mg/dL (ref 0.40–1.50)
GFR: 93.71 mL/min (ref >60.00)
Glucose, Bld: 94 mg/dL (ref 70–99)
Potassium: 4.3 mEq/L (ref 3.5–5.1)
Sodium: 139 mEq/L (ref 135–145)
Total Bilirubin: 0.6 mg/dL (ref 0.2–1.2)
Total Protein: 7.2 g/dL (ref 6.0–8.3)

## 2020-12-24 LAB — CBC
HCT: 41.4 % (ref 39.0–52.0)
Hemoglobin: 13.9 g/dL (ref 13.0–17.0)
MCHC: 33.7 g/dL (ref 30.0–36.0)
MCV: 87.2 fl (ref 78.0–100.0)
Platelets: 370 10*3/uL (ref 150.0–400.0)
RBC: 4.74 Mil/uL (ref 4.22–5.81)
RDW: 13 % (ref 11.5–15.5)
WBC: 7.4 10*3/uL (ref 4.0–10.5)

## 2020-12-24 LAB — LIPID PANEL
Cholesterol: 157 mg/dL (ref 0–200)
HDL: 28.5 mg/dL — ABNORMAL LOW (ref >39.00)
LDL Cholesterol: 105 mg/dL — ABNORMAL HIGH (ref 0–99)
NonHDL: 128.29
Total CHOL/HDL Ratio: 6
Triglycerides: 118 mg/dL (ref 0.0–149.0)
VLDL: 23.6 mg/dL (ref 0.0–40.0)

## 2020-12-24 NOTE — Assessment & Plan Note (Signed)
Doing well on PRN Benzaclin Gel, continue same.

## 2020-12-24 NOTE — Assessment & Plan Note (Signed)
Immunizations UTD. ° °Discussed the importance of a healthy diet and regular exercise in order for weight loss, and to reduce the risk of further co-morbidity. ° °Exam today stable. °Labs pending. °

## 2020-12-24 NOTE — Assessment & Plan Note (Signed)
Controlled in the office today, continue lisinopril 20 mg.  CMP pending.  

## 2020-12-24 NOTE — Assessment & Plan Note (Signed)
Stable, no concerns today. Continue to monitor.  

## 2020-12-24 NOTE — Assessment & Plan Note (Signed)
A1C's negative historically.  Discussed the importance of a healthy diet and regular exercise in order for weight loss, and to reduce the risk of further co-morbidity.

## 2020-12-24 NOTE — Patient Instructions (Signed)
Stop by the lab prior to leaving today. I will notify you of your results once received.  ° °It was a pleasure to see you today! ° °Preventive Care 21-24 Years Old, Male °Preventive care refers to lifestyle choices and visits with your health care provider that can promote health and wellness. Preventive care visits are also called wellness exams. °What can I expect for my preventive care visit? °Counseling °During your preventive care visit, your health care provider may ask about your: °Medical history, including: °Past medical problems. °Family medical history. °Current health, including: °Emotional well-being. °Home life and relationship well-being. °Sexual activity. °Lifestyle, including: °Alcohol, nicotine or tobacco, and drug use. °Access to firearms. °Diet, exercise, and sleep habits. °Safety issues such as seatbelt and bike helmet use. °Sunscreen use. °Work and work environment. °Physical exam °Your health care provider may check your: °Height and weight. These may be used to calculate your BMI (body mass index). BMI is a measurement that tells if you are at a healthy weight. °Waist circumference. This measures the distance around your waistline. This measurement also tells if you are at a healthy weight and may help predict your risk of certain diseases, such as type 2 diabetes and high blood pressure. °Heart rate and blood pressure. °Body temperature. °Skin for abnormal spots. °What immunizations do I need? °Vaccines are usually given at various ages, according to a schedule. Your health care provider will recommend vaccines for you based on your age, medical history, and lifestyle or other factors, such as travel or where you work. °What tests do I need? °Screening °Your health care provider may recommend screening tests for certain conditions. This may include: °Lipid and cholesterol levels. °Diabetes screening. This is done by checking your blood sugar (glucose) after you have not eaten for a while  (fasting). °Hepatitis B test. °Hepatitis C test. °HIV (human immunodeficiency virus) test. °STI (sexually transmitted infection) testing, if you are at risk. °Talk with your health care provider about your test results, treatment options, and if necessary, the need for more tests. °Follow these instructions at home: °Eating and drinking ° °Eat a healthy diet that includes fresh fruits and vegetables, whole grains, lean protein, and low-fat dairy products. °Drink enough fluid to keep your urine pale yellow. °Take vitamin and mineral supplements as recommended by your health care provider. °Do not drink alcohol if your health care provider tells you not to drink. °If you drink alcohol: °Limit how much you have to 0-2 drinks a day. °Know how much alcohol is in your drink. In the [place] one drink equals one 12 oz bottle of beer (355 mL), one 5 oz glass of wine (148 mL), or one 1½ oz glass of hard liquor (44 mL). °Lifestyle °Brush your teeth every morning and night with fluoride toothpaste. Floss one time each day. °Exercise for at least 30 minutes 5 or more days each week. °Do not use any products that contain nicotine or tobacco. These products include cigarettes, chewing tobacco, and vaping devices, such as e-cigarettes. If you need help quitting, ask your health care provider. °Do not use drugs. °If you are sexually active, practice safe sex. Use a condom or other form of protection to prevent STIs. °Find healthy ways to manage stress, such as: °Meditation, yoga, or listening to music. °Journaling. °Talking to a trusted person. °Spending time with friends and family. °Minimize exposure to UV radiation to reduce your risk of skin cancer. °Safety °Always wear your seat belt while driving or riding in a   vehicle. °Do not drive: °If you have been drinking alcohol. Do not ride with someone who has been drinking. °If you have been using any mind-altering substances or drugs. °While texting. °When you are tired or  distracted. °Wear a helmet and other protective equipment during sports activities. °If you have firearms in your house, make sure you follow all gun safety procedures. °Seek help if you have been physically or sexually abused. °What's next? °Go to your health care provider once a year for an annual wellness visit. °Ask your health care provider how often you should have your eyes and teeth checked. °Stay up to date on all vaccines. °This information is not intended to replace advice given to you by your health care provider. Make sure you discuss any questions you have with your health care provider. °Document Revised: 06/24/2020 Document Reviewed: 06/24/2020 °Elsevier Patient Education © 2022 [employer] ° °

## 2020-12-24 NOTE — Progress Notes (Signed)
Subjective:    Patient ID: Cody Gill, male    DOB: August 03, 1995, 25 y.o.   MRN: [id number]  HPI  Cody Gill is a very pleasant 25 y.o. male with a history of hypertension, hyperglycemia for complete physical and follow up of chronic conditions.  Immunizations: -Tetanus: 2018 -Influenza: Due -Covid-19: Has not completed  -HPV: Completed series  Diet: Fair diet.  Exercise: No regular exercise.  Eye exam: no recent visit Dental exam: Completes semi-annually   BP Readings from Last 3 Encounters:  12/24/20 120/62  06/19/20 122/74  05/11/20 135/80      Review of Systems  Constitutional:  Negative for unexpected weight change.  HENT:  Negative for rhinorrhea.   Respiratory:  Negative for shortness of breath.   Cardiovascular:  Negative for chest pain.  Gastrointestinal:  Negative for constipation and diarrhea.  Genitourinary:  Negative for difficulty urinating.  Musculoskeletal:  Positive for arthralgias. Negative for myalgias.  Skin:  Negative for rash.  Allergic/Immunologic: Positive for environmental allergies.  Neurological:  Negative for dizziness, numbness and headaches.  Psychiatric/Behavioral:  The patient is not nervous/anxious.         Past Medical History:  Diagnosis Date   Acne vulgaris    Elevated blood pressure reading     Social History   Socioeconomic History   Marital status: Single    Spouse name: Not on file   Number of children: Not on file   Years of education: Not on file   Highest education level: Not on file  Occupational History   Not on file  Tobacco Use   Smoking status: Never   Smokeless tobacco: Never  Substance and Sexual Activity   Alcohol use: No   Drug use: No   Sexual activity: Not on file  Other Topics Concern   Not on file  Social History Narrative   Single.   Works as a Games developer.   Enjoys working on cars.    Social Determinants of Health   Financial Resource Strain: Not on file  Food  Insecurity: Not on file  Transportation Needs: Not on file  Physical Activity: Not on file  Stress: Not on file  Social Connections: Not on file  Intimate Partner Violence: Not on file    No past surgical history on file.  Family History  Problem Relation Age of Onset   Hypertension Mother    Hypertension Father    Hypertension Maternal Grandmother    Diabetes Maternal Grandmother     No Known Allergies  Current Outpatient Medications on File Prior to Visit  Medication Sig Dispense Refill   cetirizine (ZYRTEC) 10 MG tablet Take 10 mg by mouth daily.     clindamycin-benzoyl peroxide (BENZACLIN) gel APPLY 1 APPLICATION TO AFFECTED 2 TIMES A DAY 50 g 1   lisinopril (ZESTRIL) 20 MG tablet Take 1 tablet (20 mg total) by mouth daily. For blood pressure. Office visit required for further refills. 30 tablet 0   omeprazole (PRILOSEC) 20 MG capsule Take 1 capsule (20 mg total) by mouth daily. For heartburn. 30 capsule 0   No current facility-administered medications on file prior to visit.    BP 120/62    Pulse 97    Temp 98.3 F (36.8 C) (Temporal)    Ht 6\' 5"  (1.956 m)    Wt (!) 353 lb (160.1 kg)    SpO2 94%    BMI 41.86 kg/m  Objective:   Physical Exam HENT:     Right Ear: Tympanic  membrane and ear canal normal.     Left Ear: Tympanic membrane and ear canal normal.     Nose: Nose normal.     Right Sinus: No maxillary sinus tenderness or frontal sinus tenderness.     Left Sinus: No maxillary sinus tenderness or frontal sinus tenderness.  Eyes:     Conjunctiva/sclera: Conjunctivae normal.  Neck:     Thyroid: No thyromegaly.     Vascular: No carotid bruit.  Cardiovascular:     Rate and Rhythm: Normal rate and regular rhythm.     Heart sounds: Normal heart sounds.  Pulmonary:     Effort: Pulmonary effort is normal.     Breath sounds: Normal breath sounds. No wheezing or rales.  Abdominal:     General: Bowel sounds are normal.     Palpations: Abdomen is soft.      Tenderness: There is no abdominal tenderness.  Musculoskeletal:        General: Normal range of motion.     Cervical back: Neck supple.  Skin:    General: Skin is warm and dry.  Neurological:     Mental Status: He is alert and oriented to person, place, and time.     Cranial Nerves: No cranial nerve deficit.     Deep Tendon Reflexes: Reflexes are normal and symmetric.  Psychiatric:        Mood and Affect: Mood normal.          Assessment & Plan:      This visit occurred during the SARS-CoV-2 public health emergency.  Safety protocols were in place, including screening questions prior to the visit, additional usage of staff PPE, and extensive cleaning of exam room while observing appropriate contact time as indicated for disinfecting solutions.

## 2020-12-25 LAB — HIV ANTIBODY (ROUTINE TESTING W REFLEX): HIV 1&2 Ab, 4th Generation: NONREACTIVE

## 2020-12-25 LAB — HEPATITIS C ANTIBODY
Hepatitis C Ab: NONREACTIVE
SIGNAL TO CUT-OFF: 0.02 (ref <1.00)

## 2021-01-16 ENCOUNTER — Other Ambulatory Visit: Payer: Self-pay | Admitting: Primary Care

## 2021-01-16 DIAGNOSIS — I10 Essential (primary) hypertension: Secondary | ICD-10-CM

## 2021-12-21 ENCOUNTER — Other Ambulatory Visit: Payer: PRIVATE HEALTH INSURANCE

## 2021-12-28 ENCOUNTER — Ambulatory Visit (INDEPENDENT_AMBULATORY_CARE_PROVIDER_SITE_OTHER): Payer: BC Managed Care – PPO | Admitting: Primary Care

## 2021-12-28 ENCOUNTER — Encounter: Payer: Self-pay | Admitting: Primary Care

## 2021-12-28 VITALS — BP 130/82 | HR 80 | Temp 97.2°F | Ht 77.0 in | Wt 365.0 lb

## 2021-12-28 DIAGNOSIS — Z0000 Encounter for general adult medical examination without abnormal findings: Secondary | ICD-10-CM | POA: Diagnosis not present

## 2021-12-28 DIAGNOSIS — Z23 Encounter for immunization: Secondary | ICD-10-CM | POA: Diagnosis not present

## 2021-12-28 DIAGNOSIS — I10 Essential (primary) hypertension: Secondary | ICD-10-CM

## 2021-12-28 LAB — COMPREHENSIVE METABOLIC PANEL
ALT: 24 U/L (ref 0–53)
AST: 18 U/L (ref 0–37)
Albumin: 4.7 g/dL (ref 3.5–5.2)
Alkaline Phosphatase: 89 U/L (ref 39–117)
BUN: 16 mg/dL (ref 6–23)
CO2: 30 mEq/L (ref 19–32)
Calcium: 10.1 mg/dL (ref 8.4–10.5)
Chloride: 100 mEq/L (ref 96–112)
Creatinine, Ser: 1.03 mg/dL (ref 0.40–1.50)
GFR: 100.69 mL/min (ref >60.00)
Glucose, Bld: 95 mg/dL (ref 70–99)
Potassium: 4.4 mEq/L (ref 3.5–5.1)
Sodium: 136 mEq/L (ref 135–145)
Total Bilirubin: 0.4 mg/dL (ref 0.2–1.2)
Total Protein: 7.5 g/dL (ref 6.0–8.3)

## 2021-12-28 LAB — LIPID PANEL
Cholesterol: 157 mg/dL (ref 0–200)
HDL: 33.7 mg/dL — ABNORMAL LOW (ref >39.00)
LDL Cholesterol: 103 mg/dL — ABNORMAL HIGH (ref 0–99)
NonHDL: 123.31
Total CHOL/HDL Ratio: 5
Triglycerides: 101 mg/dL (ref 0.0–149.0)
VLDL: 20.2 mg/dL (ref 0.0–40.0)

## 2021-12-28 NOTE — Assessment & Plan Note (Signed)
Immunizations UTD. Influenza vaccine provided today.  Discussed the importance of a healthy diet and regular exercise in order for weight loss, and to reduce the risk of further co-morbidity.  Exam stable. Labs pending.  Follow up in 1 year for repeat physical.

## 2021-12-28 NOTE — Patient Instructions (Signed)
Stop by the lab prior to leaving today. I will notify you of your results once received.   It was a pleasure to see you today!  Preventive Care 21-25 Years Old, Male Preventive care refers to lifestyle choices and visits with your health care provider that can promote health and wellness. Preventive care visits are also called wellness exams. What can I expect for my preventive care visit? Counseling During your preventive care visit, your health care provider may ask about your: Medical history, including: Past medical problems. Family medical history. Current health, including: Emotional well-being. Home life and relationship well-being. Sexual activity. Lifestyle, including: Alcohol, nicotine or tobacco, and drug use. Access to firearms. Diet, exercise, and sleep habits. Safety issues such as seatbelt and bike helmet use. Sunscreen use. Work and work environment. Physical exam Your health care provider may check your: Height and weight. These may be used to calculate your BMI (body mass index). BMI is a measurement that tells if you are at a healthy weight. Waist circumference. This measures the distance around your waistline. This measurement also tells if you are at a healthy weight and may help predict your risk of certain diseases, such as type 2 diabetes and high blood pressure. Heart rate and blood pressure. Body temperature. Skin for abnormal spots. What immunizations do I need?  Vaccines are usually given at various ages, according to a schedule. Your health care provider will recommend vaccines for you based on your age, medical history, and lifestyle or other factors, such as travel or where you work. What tests do I need? Screening Your health care provider may recommend screening tests for certain conditions. This may include: Lipid and cholesterol levels. Diabetes screening. This is done by checking your blood sugar (glucose) after you have not eaten for a while  (fasting). Hepatitis B test. Hepatitis C test. HIV (human immunodeficiency virus) test. STI (sexually transmitted infection) testing, if you are at risk. Talk with your health care provider about your test results, treatment options, and if necessary, the need for more tests. Follow these instructions at home: Eating and drinking  Eat a healthy diet that includes fresh fruits and vegetables, whole grains, lean protein, and low-fat dairy products. Drink enough fluid to keep your urine pale yellow. Take vitamin and mineral supplements as recommended by your health care provider. Do not drink alcohol if your health care provider tells you not to drink. If you drink alcohol: Limit how much you have to 0-2 drinks a day. Know how much alcohol is in your drink. In the [place] one drink equals one 12 oz bottle of beer (355 mL), one 5 oz glass of wine (148 mL), or one 1 oz glass of hard liquor (44 mL). Lifestyle Brush your teeth every morning and night with fluoride toothpaste. Floss one time each day. Exercise for at least 30 minutes 5 or more days each week. Do not use any products that contain nicotine or tobacco. These products include cigarettes, chewing tobacco, and vaping devices, such as e-cigarettes. If you need help quitting, ask your health care provider. Do not use drugs. If you are sexually active, practice safe sex. Use a condom or other form of protection to prevent STIs. Find healthy ways to manage stress, such as: Meditation, yoga, or listening to music. Journaling. Talking to a trusted person. Spending time with friends and family. Minimize exposure to UV radiation to reduce your risk of skin cancer. Safety Always wear your seat belt while driving or riding in   a vehicle. Do not drive: If you have been drinking alcohol. Do not ride with someone who has been drinking. If you have been using any mind-altering substances or drugs. While texting. When you are tired or  distracted. Wear a helmet and other protective equipment during sports activities. If you have firearms in your house, make sure you follow all gun safety procedures. Seek help if you have been physically or sexually abused. What's next? Go to your health care provider once a year for an annual wellness visit. Ask your health care provider how often you should have your eyes and teeth checked. Stay up to date on all vaccines. This information is not intended to replace advice given to you by your health care provider. Make sure you discuss any questions you have with your health care provider. Document Revised: 06/24/2020 Document Reviewed: 06/24/2020 Elsevier Patient Education  2023 [employer]vier Inc.  

## 2021-12-28 NOTE — Progress Notes (Signed)
Subjective:    Patient ID: Cody Gill, male    DOB: 01/30/1995, 26 y.o.   MRN: [id number]  HPI  Cody Gill is a very pleasant 26 y.o. male who presents today for complete physical and follow up of chronic conditions.  Immunizations: -Tetanus: 2016 -Influenza: Completed today  -HPV: Completed series  Diet: Fair diet.  Exercise: No regular exercise.  Eye exam: Completed years ago.  Dental exam: Completes semi-annually    BP Readings from Last 3 Encounters:  12/28/21 130/82  12/24/20 120/62  06/19/20 122/74   Wt Readings from Last 3 Encounters:  12/28/21 (!) 365 lb (165.6 kg)  12/24/20 (!) 353 lb (160.1 kg)  06/19/20 (!) 347 lb (157.4 kg)      Review of Systems  Constitutional:  Negative for unexpected weight change.  HENT:  Negative for rhinorrhea.   Respiratory:  Positive for cough. Negative for shortness of breath.   Cardiovascular:  Negative for chest pain.  Gastrointestinal:  Negative for constipation and diarrhea.  Genitourinary:  Negative for difficulty urinating.  Musculoskeletal:  Negative for arthralgias and myalgias.  Skin:  Negative for rash.  Allergic/Immunologic: Negative for environmental allergies.  Neurological:  Negative for dizziness and headaches.  Psychiatric/Behavioral:  The patient is not nervous/anxious.          Past Medical History:  Diagnosis Date   Acne vulgaris    Elevated blood pressure reading     Social History   Socioeconomic History   Marital status: Single    Spouse name: Not on file   Number of children: Not on file   Years of education: Not on file   Highest education level: Not on file  Occupational History   Not on file  Tobacco Use   Smoking status: Never   Smokeless tobacco: Never  Substance and Sexual Activity   Alcohol use: No   Drug use: No   Sexual activity: Not on file  Other Topics Concern   Not on file  Social History Narrative   Single.   Works as a Games developer.   Enjoys  working on cars.    Social Determinants of Health   Financial Resource Strain: Not on file  Food Insecurity: Not on file  Transportation Needs: Not on file  Physical Activity: Not on file  Stress: Not on file  Social Connections: Not on file  Intimate Partner Violence: Not on file    History reviewed. No pertinent surgical history.  Family History  Problem Relation Age of Onset   Hypertension Mother    Hypertension Father    Hypertension Maternal Grandmother    Diabetes Maternal Grandmother     No Known Allergies  Current Outpatient Medications on File Prior to Visit  Medication Sig Dispense Refill   cetirizine (ZYRTEC) 10 MG tablet Take 10 mg by mouth daily.     clindamycin-benzoyl peroxide (BENZACLIN) gel APPLY 1 APPLICATION TO AFFECTED 2 TIMES A DAY 50 g 1   lisinopril (ZESTRIL) 20 MG tablet Take 1 tablet (20 mg total) by mouth daily. For blood pressure. 90 tablet 3   omeprazole (PRILOSEC) 20 MG capsule Take 1 capsule (20 mg total) by mouth daily. For heartburn. 30 capsule 0   No current facility-administered medications on file prior to visit.    BP 130/82   Pulse 80   Temp (!) 97.2 F (36.2 C) (Temporal)   Ht 6\' 5"  (1.956 m)   Wt (!) 365 lb (165.6 kg)   SpO2 97%   BMI  43.28 kg/m  Objective:   Physical Exam HENT:     Right Ear: Tympanic membrane and ear canal normal.     Left Ear: Tympanic membrane and ear canal normal.     Nose: Nose normal.     Right Sinus: No maxillary sinus tenderness or frontal sinus tenderness.     Left Sinus: No maxillary sinus tenderness or frontal sinus tenderness.  Eyes:     Conjunctiva/sclera: Conjunctivae normal.  Neck:     Thyroid: No thyromegaly.     Vascular: No carotid bruit.  Cardiovascular:     Rate and Rhythm: Normal rate and regular rhythm.     Heart sounds: Normal heart sounds.  Pulmonary:     Effort: Pulmonary effort is normal.     Breath sounds: Normal breath sounds. No wheezing or rales.  Abdominal:      General: Bowel sounds are normal.     Palpations: Abdomen is soft.     Tenderness: There is no abdominal tenderness.  Musculoskeletal:        General: Normal range of motion.     Cervical back: Neck supple.  Skin:    General: Skin is warm and dry.  Neurological:     Mental Status: He is alert and oriented to person, place, and time.     Cranial Nerves: No cranial nerve deficit.     Deep Tendon Reflexes: Reflexes are normal and symmetric.  Psychiatric:        Mood and Affect: Mood normal.           Assessment & Plan:   Problem List Items Addressed This Visit       Cardiovascular and Mediastinum   Essential hypertension    Overall controlled, home readings are better.  He does have what sounds like an ACE induced cough. Offered to switch to another BP agent, he kindly declines for now but will update if cough becomes bothersome.  Encouraged weight loss through diet and exercise.  Continue lisinopril 20 mg daily.   CMP pending today.      Relevant Orders   Comprehensive metabolic panel   Lipid panel     Other   Preventative health care - Primary    Immunizations UTD. Influenza vaccine provided today.  Discussed the importance of a healthy diet and regular exercise in order for weight loss, and to reduce the risk of further co-morbidity.  Exam stable. Labs pending.  Follow up in 1 year for repeat physical.       Other Visit Diagnoses     Need for immunization against influenza       Relevant Orders   Flu Vaccine QUAD 81mo+IM (Fluarix, Fluzone & Alfiuria Quad PF) (Completed)          Doreene Nest, NP

## 2021-12-28 NOTE — Assessment & Plan Note (Addendum)
Overall controlled, home readings are better.  He does have what sounds like an ACE induced cough. Offered to switch to another BP agent, he kindly declines for now but will update if cough becomes bothersome.  Encouraged weight loss through diet and exercise.  Continue lisinopril 20 mg daily.   CMP pending today.

## 2022-01-17 ENCOUNTER — Other Ambulatory Visit: Payer: Self-pay | Admitting: Primary Care

## 2022-01-17 DIAGNOSIS — I10 Essential (primary) hypertension: Secondary | ICD-10-CM

## 2022-12-30 ENCOUNTER — Encounter: Payer: PRIVATE HEALTH INSURANCE | Admitting: Primary Care

## 2023-01-05 ENCOUNTER — Encounter: Payer: Self-pay | Admitting: Primary Care

## 2023-01-06 ENCOUNTER — Encounter: Payer: Self-pay | Admitting: Primary Care

## 2023-01-06 ENCOUNTER — Ambulatory Visit (INDEPENDENT_AMBULATORY_CARE_PROVIDER_SITE_OTHER): Payer: Managed Care, Other (non HMO) | Admitting: Primary Care

## 2023-01-06 VITALS — BP 128/80 | HR 80 | Temp 98.0°F | Ht 77.0 in | Wt 367.0 lb

## 2023-01-06 DIAGNOSIS — Z23 Encounter for immunization: Secondary | ICD-10-CM | POA: Diagnosis not present

## 2023-01-06 DIAGNOSIS — L700 Acne vulgaris: Secondary | ICD-10-CM | POA: Diagnosis not present

## 2023-01-06 DIAGNOSIS — Z0001 Encounter for general adult medical examination with abnormal findings: Secondary | ICD-10-CM

## 2023-01-06 DIAGNOSIS — E162 Hypoglycemia, unspecified: Secondary | ICD-10-CM | POA: Insufficient documentation

## 2023-01-06 DIAGNOSIS — I10 Essential (primary) hypertension: Secondary | ICD-10-CM | POA: Diagnosis not present

## 2023-01-06 HISTORY — DX: Hypoglycemia, unspecified: E16.2

## 2023-01-06 LAB — LIPID PANEL
Cholesterol: 171 mg/dL (ref 0–200)
HDL: 31.3 mg/dL — ABNORMAL LOW (ref >39.00)
LDL Cholesterol: 117 mg/dL — ABNORMAL HIGH (ref 0–99)
NonHDL: 139.65
Total CHOL/HDL Ratio: 5
Triglycerides: 111 mg/dL (ref 0.0–149.0)
VLDL: 22.2 mg/dL (ref 0.0–40.0)

## 2023-01-06 LAB — COMPREHENSIVE METABOLIC PANEL
ALT: 27 U/L (ref 0–53)
AST: 21 U/L (ref 0–37)
Albumin: 4.5 g/dL (ref 3.5–5.2)
Alkaline Phosphatase: 93 U/L (ref 39–117)
BUN: 12 mg/dL (ref 6–23)
CO2: 28 meq/L (ref 19–32)
Calcium: 9.4 mg/dL (ref 8.4–10.5)
Chloride: 102 meq/L (ref 96–112)
Creatinine, Ser: 1.07 mg/dL (ref 0.40–1.50)
GFR: 95.5 mL/min (ref >60.00)
Glucose, Bld: 99 mg/dL (ref 70–99)
Potassium: 4.5 meq/L (ref 3.5–5.1)
Sodium: 140 meq/L (ref 135–145)
Total Bilirubin: 0.4 mg/dL (ref 0.2–1.2)
Total Protein: 6.8 g/dL (ref 6.0–8.3)

## 2023-01-06 LAB — CBC
HCT: 43.9 % (ref 39.0–52.0)
Hemoglobin: 14.6 g/dL (ref 13.0–17.0)
MCHC: 33.2 g/dL (ref 30.0–36.0)
MCV: 89.3 fL (ref 78.0–100.0)
Platelets: 327 10*3/uL (ref 150.0–400.0)
RBC: 4.91 Mil/uL (ref 4.22–5.81)
RDW: 13.3 % (ref 11.5–15.5)
WBC: 8 10*3/uL (ref 4.0–10.5)

## 2023-01-06 LAB — TSH: TSH: 2.22 u[IU]/mL (ref 0.35–5.50)

## 2023-01-06 LAB — HEMOGLOBIN A1C: Hgb A1c MFr Bld: 6 % (ref 4.6–6.5)

## 2023-01-06 NOTE — Assessment & Plan Note (Signed)
Controlled.  Continue lisinopril 20 mg daily.  CMP pending.

## 2023-01-06 NOTE — Assessment & Plan Note (Signed)
Controlled. No concerns today. 

## 2023-01-06 NOTE — Progress Notes (Signed)
Subjective:    Patient ID: Cody Gill, male    DOB: [birth year], 27 y.o.   MRN: [id number]  HPI  Cody Gill is a very pleasant 27 y.o. male who presents today for complete physical and follow up of chronic conditions.  He would also like to discuss hypoglycemia. Over the last six months he's noticed intermittent symptoms of feeing shaky, blurred vision, feeling weak, feeling clammy. Symptoms improve with eating anything sweet. He's checked his glucose a few times during those episodes and has seen readings in the 30s and 40s. Symptoms initially were infrequent, but over the last few weeks his symptoms have been more frequent.   Diet currently consists of:  Breakfast: Sausage/bacon, egg, cheese biscuit or fast food Lunch: Arts development officer: Meat, veggie, starch Snacks: None Desserts: Candy on occasion  Beverages: Mostly water, sweet tea   Immunizations: -Tetanus: Completed in 2018 -Influenza: Influenza vaccine provided today.   Diet: Fair diet.  Exercise: No regular exercise.  Eye exam: Completed years ago.  Dental exam: Completes semi-annually    BP Readings from Last 3 Encounters:  01/06/23 128/80  12/28/21 130/82  12/24/20 120/62    Wt Readings from Last 3 Encounters:  01/06/23 (!) 367 lb (166.5 kg)  12/28/21 (!) 365 lb (165.6 kg)  12/24/20 (!) 353 lb (160.1 kg)        Review of Systems  Constitutional:  Negative for unexpected weight change.  HENT:  Negative for rhinorrhea.   Respiratory:  Negative for cough and shortness of breath.   Cardiovascular:  Negative for chest pain.  Gastrointestinal:  Negative for constipation and diarrhea.  Genitourinary:  Negative for difficulty urinating.  Musculoskeletal:  Negative for arthralgias and myalgias.  Skin:  Negative for rash.  Allergic/Immunologic: Negative for environmental allergies.  Neurological:  Negative for dizziness, numbness and headaches.  Psychiatric/Behavioral:  The patient is not  nervous/anxious.          Past Medical History:  Diagnosis Date   Acne vulgaris    Chronic elbow pain 12/04/2017   Elevated blood pressure reading    Hyperglycemia 12/13/2019   Low back pain 05/11/2020    Social History   Socioeconomic History   Marital status: Single    Spouse name: Not on file   Number of children: Not on file   Years of education: Not on file   Highest education level: Not on file  Occupational History   Not on file  Tobacco Use   Smoking status: Never   Smokeless tobacco: Never  Substance and Sexual Activity   Alcohol use: No   Drug use: No   Sexual activity: Not on file  Other Topics Concern   Not on file  Social History Narrative   Single.   Works as a Games developer.   Enjoys working on cars.    Social Drivers of Corporate investment banker Strain: Not on file  Food Insecurity: Not on file  Transportation Needs: Not on file  Physical Activity: Not on file  Stress: Not on file  Social Connections: Not on file  Intimate Partner Violence: Not on file    History reviewed. No pertinent surgical history.  Family History  Problem Relation Age of Onset   Hypertension Mother    Hypertension Father    Hypertension Maternal Grandmother    Diabetes Maternal Grandmother    Diabetes Maternal Grandfather     No Known Allergies  Current Outpatient Medications on File Prior to Visit  Medication Sig Dispense  Refill   cetirizine (ZYRTEC) 10 MG tablet Take 10 mg by mouth daily.     lisinopril (ZESTRIL) 20 MG tablet TAKE 1 TABLET (20 MG TOTAL) BY MOUTH DAILY. FOR BLOOD PRESSURE. 90 tablet 3   No current facility-administered medications on file prior to visit.    BP 128/80   Pulse 80   Temp 98 F (36.7 C) (Temporal)   Ht 6\' 5"  (1.956 m)   Wt (!) 367 lb (166.5 kg)   SpO2 97%   BMI 43.52 kg/m  Objective:   Physical Exam HENT:     Right Ear: Tympanic membrane and ear canal normal.     Left Ear: Tympanic membrane and ear canal  normal.  Eyes:     Pupils: Pupils are equal, round, and reactive to light.  Cardiovascular:     Rate and Rhythm: Normal rate and regular rhythm.  Pulmonary:     Effort: Pulmonary effort is normal.     Breath sounds: Normal breath sounds.  Abdominal:     General: Bowel sounds are normal.     Palpations: Abdomen is soft.     Tenderness: There is no abdominal tenderness.  Musculoskeletal:        General: Normal range of motion.     Cervical back: Neck supple.  Skin:    General: Skin is warm and dry.  Neurological:     Mental Status: He is alert and oriented to person, place, and time.     Cranial Nerves: No cranial nerve deficit.     Deep Tendon Reflexes:     Reflex Scores:      Patellar reflexes are 2+ on the right side and 2+ on the left side. Psychiatric:        Mood and Affect: Mood normal.           Assessment & Plan:  Encounter for annual general medical examination with abnormal findings in adult Assessment & Plan: Immunizations UTD. Influenza vaccine provided today.   Discussed the importance of a healthy diet and regular exercise in order for weight loss, and to reduce the risk of further co-morbidity.  Exam stable. Labs pending.  Follow up in 1 year for repeat physical.    Hypoglycemia Assessment & Plan: Unclear etiology, especially since he is not currently on medications that would cause hypoglycemia.  Checking labs today including CMP, A1c, TSH, cortisol, CBC. Freestyle libre 3 sample provided for patient to wear for the next 2 weeks. He will update.  We also discussed to work on diet, replacing carb foods with protein, limiting fast food and restaurant food.  Orders: -     TSH -     Cortisol-am, blood  Essential hypertension Assessment & Plan: Controlled.  Continue lisinopril 20 mg daily. CMP pending.  Orders: -     Lipid panel -     Hemoglobin A1c -     CBC -     Comprehensive metabolic panel  Encounter for immunization -     Flu  vaccine trivalent PF, 6mos and older(Flulaval,Afluria,Fluarix,Fluzone)  Acne vulgaris Assessment & Plan: Controlled. No concerns today.         Doreene Nest, NP

## 2023-01-06 NOTE — Assessment & Plan Note (Signed)
Unclear etiology, especially since he is not currently on medications that would cause hypoglycemia.  Checking labs today including CMP, A1c, TSH, cortisol, CBC. Freestyle libre 3 sample provided for patient to wear for the next 2 weeks. He will update.  We also discussed to work on diet, replacing carb foods with protein, limiting fast food and restaurant food.

## 2023-01-06 NOTE — Assessment & Plan Note (Signed)
Immunizations UTD. Influenza vaccine provided today.  Discussed the importance of a healthy diet and regular exercise in order for weight loss, and to reduce the risk of further co-morbidity.  Exam stable. Labs pending.  Follow up in 1 year for repeat physical.  

## 2023-01-06 NOTE — Patient Instructions (Signed)
Stop by the lab prior to leaving today. I will notify you of your results once received.   Insert the freestyle libre 3 to monitor your blood sugars for the next 2 weeks.  Improve your diet by reducing fast food and restaurant food.  Increase intake of protein to prevent drops in blood sugar.  Limit sweet tea.  It was a pleasure to see you today!

## 2023-01-09 LAB — CORTISOL-AM, BLOOD: Cortisol - AM: 21.5 ug/dL

## 2023-01-15 ENCOUNTER — Other Ambulatory Visit: Payer: Self-pay | Admitting: Primary Care

## 2023-01-15 DIAGNOSIS — I10 Essential (primary) hypertension: Secondary | ICD-10-CM

## 2024-01-09 ENCOUNTER — Other Ambulatory Visit: Payer: Self-pay | Admitting: Primary Care

## 2024-01-09 ENCOUNTER — Encounter: Payer: Managed Care, Other (non HMO) | Admitting: Primary Care

## 2024-01-09 DIAGNOSIS — I10 Essential (primary) hypertension: Secondary | ICD-10-CM

## 2024-01-31 ENCOUNTER — Ambulatory Visit: Admitting: Primary Care

## 2024-01-31 ENCOUNTER — Ambulatory Visit: Payer: Self-pay | Admitting: Primary Care

## 2024-01-31 ENCOUNTER — Encounter: Payer: Self-pay | Admitting: Primary Care

## 2024-01-31 VITALS — BP 134/76 | HR 97 | Temp 97.8°F | Ht 75.0 in | Wt 358.1 lb

## 2024-01-31 DIAGNOSIS — Z0000 Encounter for general adult medical examination without abnormal findings: Secondary | ICD-10-CM | POA: Diagnosis not present

## 2024-01-31 DIAGNOSIS — E785 Hyperlipidemia, unspecified: Secondary | ICD-10-CM | POA: Diagnosis not present

## 2024-01-31 DIAGNOSIS — I10 Essential (primary) hypertension: Secondary | ICD-10-CM

## 2024-01-31 DIAGNOSIS — R7303 Prediabetes: Secondary | ICD-10-CM | POA: Diagnosis not present

## 2024-01-31 DIAGNOSIS — Z23 Encounter for immunization: Secondary | ICD-10-CM

## 2024-01-31 LAB — LIPID PANEL
Cholesterol: 150 mg/dL (ref 28–200)
HDL: 28.5 mg/dL — ABNORMAL LOW
LDL Cholesterol: 89 mg/dL (ref 10–99)
NonHDL: 121.53
Total CHOL/HDL Ratio: 5
Triglycerides: 161 mg/dL — ABNORMAL HIGH (ref 10.0–149.0)
VLDL: 32.2 mg/dL (ref 0.0–40.0)

## 2024-01-31 LAB — COMPREHENSIVE METABOLIC PANEL WITH GFR
ALT: 24 U/L (ref 3–53)
AST: 23 U/L (ref 5–37)
Albumin: 4.4 g/dL (ref 3.5–5.2)
Alkaline Phosphatase: 64 U/L (ref 39–117)
BUN: 16 mg/dL (ref 6–23)
CO2: 27 meq/L (ref 19–32)
Calcium: 9.5 mg/dL (ref 8.4–10.5)
Chloride: 103 meq/L (ref 96–112)
Creatinine, Ser: 1 mg/dL (ref 0.40–1.50)
GFR: 102.8 mL/min
Glucose, Bld: 102 mg/dL — ABNORMAL HIGH (ref 70–99)
Potassium: 4.2 meq/L (ref 3.5–5.1)
Sodium: 138 meq/L (ref 135–145)
Total Bilirubin: 0.7 mg/dL (ref 0.2–1.2)
Total Protein: 6.7 g/dL (ref 6.0–8.3)

## 2024-01-31 LAB — HEMOGLOBIN A1C: Hgb A1c MFr Bld: 5.8 % (ref 4.6–6.5)

## 2024-01-31 NOTE — Assessment & Plan Note (Signed)
Immunizations UTD.  Discussed the importance of a healthy diet and regular exercise in order for weight loss, and to reduce the risk of further co-morbidity.  Exam stable. Labs pending.  Follow up in 1 year for repeat physical.  

## 2024-01-31 NOTE — Patient Instructions (Signed)
 Stop by the lab prior to leaving today. I will notify you of your results once received.   It was a pleasure to see you today!

## 2024-01-31 NOTE — Assessment & Plan Note (Signed)
 Repeat lipid panel pending.  Work on a healthy diet and regular exercise in order for weight loss, and to reduce the risk of further co-morbidity.

## 2024-01-31 NOTE — Assessment & Plan Note (Signed)
 Controlled.  Continue lisinopril 20 mg daily.  CMP pending.

## 2024-01-31 NOTE — Progress Notes (Signed)
 "  Subjective:    Patient ID: Cody Gill, male    DOB: February 14, 1995, 29 y.o.   MRN: [id number]  Cody Gill is a very pleasant 29 y.o. male who presents today for complete physical and follow up of chronic conditions.  Immunizations: -Tetanus: Completed in 2018 -Influenza: Influenza vaccine provided today.  -HPV: Completed series   Diet: Fair diet.  Exercise: No regular exercise. Active  Eye exam: No recent exam Dental exam: Completes semi-annually     BP Readings from Last 3 Encounters:  01/31/24 134/76  01/06/23 128/80  12/28/21 130/82   Wt Readings from Last 3 Encounters:  01/31/24 (!) 358 lb 2 oz (162.4 kg)  01/06/23 (!) 367 lb (166.5 kg)  12/28/21 (!) 365 lb (165.6 kg)      Review of Systems  Constitutional:  Negative for unexpected weight change.  HENT:  Negative for rhinorrhea.   Respiratory:  Negative for cough and shortness of breath.   Cardiovascular:  Negative for chest pain.  Gastrointestinal:  Negative for constipation and diarrhea.  Genitourinary:  Negative for difficulty urinating.  Musculoskeletal:  Negative for arthralgias and myalgias.  Skin:  Negative for rash.  Allergic/Immunologic: Negative for environmental allergies.  Neurological:  Negative for dizziness, numbness and headaches.  Psychiatric/Behavioral:  The patient is not nervous/anxious.          Past Medical History:  Diagnosis Date   Acne vulgaris    Chronic elbow pain 12/04/2017   Elevated blood pressure reading    Hyperglycemia 12/13/2019   Hypoglycemia 01/06/2023   Low back pain 05/11/2020    Social History   Socioeconomic History   Marital status: Single    Spouse name: Not on file   Number of children: Not on file   Years of education: Not on file   Highest education level: Not on file  Occupational History   Not on file  Tobacco Use   Smoking status: Never   Smokeless tobacco: Never  Substance and Sexual Activity   Alcohol use: No   Drug use: No    Sexual activity: Not on file  Other Topics Concern   Not on file  Social History Narrative   Single.   Works as a games developer.   Enjoys working on cars.    Social Drivers of Health   Tobacco Use: Low Risk (01/31/2024)   Patient History    Smoking Tobacco Use: Never    Smokeless Tobacco Use: Never    Passive Exposure: Not on file  Financial Resource Strain: Not on file  Food Insecurity: Not on file  Transportation Needs: Not on file  Physical Activity: Not on file  Stress: Not on file  Social Connections: Not on file  Intimate Partner Violence: Not on file  Depression (PHQ2-9): Low Risk (01/06/2023)   Depression (PHQ2-9)    PHQ-2 Score: 0  Alcohol Screen: Not on file  Housing: Not on file  Utilities: Not on file  Health Literacy: Not on file    History reviewed. No pertinent surgical history.  Family History  Problem Relation Age of Onset   Hypertension Mother    Hypertension Father    Hypertension Maternal Grandmother    Diabetes Maternal Grandmother    Diabetes Maternal Grandfather     Allergies[1]  Medications Ordered Prior to Encounter[2]  BP 134/76   Pulse 97   Temp 97.8 F (36.6 C) (Oral)   Ht 6' 3 (1.905 m)   Wt (!) 358 lb 2 oz (162.4 kg)  SpO2 96%   BMI 44.76 kg/m  Objective:   Physical Exam HENT:     Right Ear: Tympanic membrane and ear canal normal.     Left Ear: Tympanic membrane and ear canal normal.  Eyes:     Pupils: Pupils are equal, round, and reactive to light.  Cardiovascular:     Rate and Rhythm: Normal rate and regular rhythm.  Pulmonary:     Effort: Pulmonary effort is normal.     Breath sounds: Normal breath sounds.  Abdominal:     General: Bowel sounds are normal.     Palpations: Abdomen is soft.     Tenderness: There is no abdominal tenderness.  Musculoskeletal:        General: Normal range of motion.     Cervical back: Neck supple.  Skin:    General: Skin is warm and dry.  Neurological:     Mental Status: He is  alert and oriented to person, place, and time.     Cranial Nerves: No cranial nerve deficit.     Deep Tendon Reflexes:     Reflex Scores:      Patellar reflexes are 2+ on the right side and 2+ on the left side. Psychiatric:        Mood and Affect: Mood normal.     Physical Exam        Assessment & Plan:  Preventative health care Assessment & Plan: Immunizations UTD.  Discussed the importance of a healthy diet and regular exercise in order for weight loss, and to reduce the risk of further co-morbidity.  Exam stable. Labs pending.  Follow up in 1 year for repeat physical.    Need for influenza vaccination -     Flu vaccine trivalent PF, 6mos and older(Flulaval,Afluria,Fluarix,Fluzone)  Essential hypertension Assessment & Plan: Controlled.  Continue lisinopril  20 mg daily. CMP pending.  Orders: -     Comprehensive metabolic panel with GFR  Prediabetes Assessment & Plan: Repeat A1C pending.  Work on a healthy diet and regular exercise in order for weight loss, and to reduce the risk of further co-morbidity.   Orders: -     Hemoglobin A1c  Hyperlipidemia, unspecified hyperlipidemia type Assessment & Plan: Repeat lipid panel pending.  Work on a healthy diet and regular exercise in order for weight loss, and to reduce the risk of further co-morbidity.   Orders: -     Lipid panel    Assessment and Plan Assessment & Plan         Cody MARLA Gaskins, NP       [1] No Known Allergies [2]  Current Outpatient Medications on File Prior to Visit  Medication Sig Dispense Refill   cetirizine (ZYRTEC) 10 MG tablet Take 10 mg by mouth daily.     lisinopril  (ZESTRIL ) 20 MG tablet TAKE 1 TABLET (20 MG TOTAL) BY MOUTH DAILY. FOR BLOOD PRESSURE. 90 tablet 0   No current facility-administered medications on file prior to visit.   "

## 2024-01-31 NOTE — Assessment & Plan Note (Signed)
 Repeat A1C pending.  Work on a healthy diet and regular exercise in order for weight loss, and to reduce the risk of further co-morbidity.

## 2025-01-31 ENCOUNTER — Encounter: Admitting: Primary Care
